# Patient Record
Sex: Female | Born: 1990 | Race: Black or African American | Hispanic: No | Marital: Single | Smoking: Never smoker
Health system: Southern US, Community
[De-identification: ages and names within clinical notes are randomized; demographics above are authoritative.]

## PROBLEM LIST (undated history)

## (undated) ENCOUNTER — Inpatient Hospital Stay (HOSPITAL_COMMUNITY): Payer: Self-pay

## (undated) DIAGNOSIS — O2342 Unspecified infection of urinary tract in pregnancy, second trimester: Secondary | ICD-10-CM

## (undated) DIAGNOSIS — A599 Trichomoniasis, unspecified: Secondary | ICD-10-CM

## (undated) DIAGNOSIS — F329 Major depressive disorder, single episode, unspecified: Secondary | ICD-10-CM

## (undated) DIAGNOSIS — A749 Chlamydial infection, unspecified: Secondary | ICD-10-CM

## (undated) DIAGNOSIS — B999 Unspecified infectious disease: Secondary | ICD-10-CM

## (undated) DIAGNOSIS — F32A Depression, unspecified: Secondary | ICD-10-CM

## (undated) DIAGNOSIS — N390 Urinary tract infection, site not specified: Secondary | ICD-10-CM

---
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
## 2012-04-24 ENCOUNTER — Encounter (HOSPITAL_COMMUNITY): Payer: Self-pay | Admitting: *Deleted

## 2012-04-24 ENCOUNTER — Inpatient Hospital Stay (HOSPITAL_COMMUNITY): Payer: Medicaid Other

## 2012-04-24 ENCOUNTER — Inpatient Hospital Stay (HOSPITAL_COMMUNITY)
Admission: AD | Admit: 2012-04-24 | Discharge: 2012-04-24 | Disposition: A | Discharge status: Home / Self Care | Payer: Medicaid Other | Source: Ambulatory Visit | Attending: Obstetrics & Gynecology | Admitting: Obstetrics & Gynecology

## 2012-04-24 DIAGNOSIS — R109 Unspecified abdominal pain: Secondary | ICD-10-CM | POA: Insufficient documentation

## 2012-04-24 DIAGNOSIS — B373 Candidiasis of vulva and vagina: Secondary | ICD-10-CM

## 2012-04-24 DIAGNOSIS — O2390 Unspecified genitourinary tract infection in pregnancy, unspecified trimester: Secondary | ICD-10-CM | POA: Insufficient documentation

## 2012-04-24 DIAGNOSIS — O99891 Other specified diseases and conditions complicating pregnancy: Secondary | ICD-10-CM | POA: Insufficient documentation

## 2012-04-24 DIAGNOSIS — B3731 Acute candidiasis of vulva and vagina: Secondary | ICD-10-CM | POA: Insufficient documentation

## 2012-04-24 DIAGNOSIS — J069 Acute upper respiratory infection, unspecified: Secondary | ICD-10-CM

## 2012-04-24 DIAGNOSIS — O3680X0 Pregnancy with inconclusive fetal viability, not applicable or unspecified: Secondary | ICD-10-CM

## 2012-04-24 DIAGNOSIS — O26899 Other specified pregnancy related conditions, unspecified trimester: Secondary | ICD-10-CM

## 2012-04-24 LAB — CBC WITH DIFFERENTIAL/PLATELET
Basophils Absolute: 0 10*3/uL (ref 0.0–0.1)
Eosinophils Absolute: 0.4 10*3/uL (ref 0.0–0.7)
Eosinophils Relative: 6 % — ABNORMAL HIGH (ref 0–5)
HCT: 37.5 % (ref 36.0–46.0)
Lymphocytes Relative: 39 % (ref 12–46)
MCH: 31.1 pg (ref 26.0–34.0)
MCHC: 34.1 g/dL (ref 30.0–36.0)
MCV: 91 fL (ref 78.0–100.0)
Monocytes Absolute: 0.8 10*3/uL (ref 0.1–1.0)
Platelets: 293 10*3/uL (ref 150–400)
RDW: 12.3 % (ref 11.5–15.5)

## 2012-04-24 LAB — POCT PREGNANCY, URINE: Preg Test, Ur: POSITIVE — AB

## 2012-04-24 LAB — URINALYSIS, ROUTINE W REFLEX MICROSCOPIC
Glucose, UA: NEGATIVE mg/dL
Hgb urine dipstick: NEGATIVE
Leukocytes, UA: NEGATIVE
pH: 6 (ref 5.0–8.0)

## 2012-04-24 LAB — WET PREP, GENITAL: Trich, Wet Prep: NONE SEEN

## 2012-04-24 LAB — HCG, QUANTITATIVE, PREGNANCY: hCG, Beta Chain, Quant, S: 11623 m[IU]/mL — ABNORMAL HIGH (ref <5)

## 2012-04-24 MED ORDER — FLUCONAZOLE 150 MG PO TABS
150.0000 mg | ORAL_TABLET | Freq: Once | ORAL | Status: AC
  Administered 2012-04-24: 150 mg via ORAL
  Filled 2012-04-24: qty 1

## 2012-04-24 NOTE — MAU Provider Note (Signed)
History     CSN: [id number]  Arrival date and time: 04/24/12 1947   First Provider Initiated Contact with Patient 04/24/12 2017      Chief Complaint  Patient presents with  . Abdominal Pain  . Back Pain   HPI Rose [name] is a 22 y.o. female who presents to MAU with abdominal pain. The pain started one week ago. She describes the pain as sharp that comes and goes. She rates the pains as 10/10. But hasn't taken anything for pain. The onset was gradual.  Unsure of LMP but positive homepregnancy test. Denies vaginal discharge or bleeding. Has had cough cold and congestion x one month. Hasn't taken any medication. Last pap smear 1 year ago and was normal. Hx of chlamydia and trichomonas. The history was provided by the patient.  OB History    Grav Para Term Preterm Abortions TAB SAB Ect Mult Living   3 2 2  0 0 0 0 0 0 2      Past Medical History  Diagnosis Date  . Urinary tract infection   . Trichomonosis   . Chlamydia     Past Surgical History  Procedure Date  . Cesarean section   . Cesarean section 02/02/2011    Procedure: CESAREAN SECTION;  Surgeon: Kathreen Cosier, MD;  Location: [facility];  Service: Gynecology;  Laterality: N/A;  Repeat    Family History  Problem Relation Age of Onset  . Diabetes Brother   . Diabetes Paternal Grandmother   . Cancer Paternal Grandfather     History  Substance Use Topics  . Smoking status: Never Smoker   . Smokeless tobacco: Not on file  . Alcohol Use: No    Allergies: No Known Allergies  Prescriptions prior to admission  Medication Sig Dispense Refill  . omeprazole (PRILOSEC) 20 MG capsule Take 1 capsule (20 mg total) by mouth daily.  30 capsule  0    Review of Systems  Constitutional: Negative for fever, chills and weight loss.  HENT: Positive for congestion and sore throat. Negative for ear pain, nosebleeds and neck pain.   Eyes: Negative for blurred vision, double vision, photophobia and pain.  Respiratory: Positive  for cough. Negative for shortness of breath and wheezing.   Cardiovascular: Negative for chest pain, palpitations and leg swelling.  Gastrointestinal: Positive for nausea, vomiting and abdominal pain. Negative for heartburn, diarrhea and constipation.  Genitourinary: Negative for dysuria, urgency and frequency.  Musculoskeletal: Positive for back pain. Negative for myalgias.  Skin: Negative for itching and rash.  Neurological: Positive for headaches. Negative for dizziness, sensory change, speech change, seizures and weakness.  Endo/Heme/Allergies: Does not bruise/bleed easily.  Psychiatric/Behavioral: Negative for depression and substance abuse. The patient is not nervous/anxious and does not have insomnia.    Blood pressure 123/56, pulse 95, temperature 98.6 F (37 C), temperature source Oral, resp. rate 16, height 4\' 11"  (1.499 m), weight 131 lb (59.421 kg), unknown if currently breastfeeding.  Physical Exam  Nursing note and vitals reviewed. Constitutional: She is oriented to person, place, and time. She appears well-developed and well-nourished. No distress.       Patient lying on exam table with her child on her abdomen, laughing and bouncing the child up and down. Appears very comfortable at this time.  HENT:  Head: Normocephalic and atraumatic.  Eyes: EOM are normal.  Neck: Neck supple.  Cardiovascular: Normal rate.   Respiratory: Effort normal.  GI: Soft. There is no tenderness.  Unable to reproduce the pain that the patient has had for the past week with deep palpation.  Genitourinary:       External genitalia without lesions. White discharge vaginal vault. Cervix long, closed, no CMT, no adnexal tenderness. Uterus slightly enlarged.   Musculoskeletal: Normal range of motion.  Neurological: She is alert and oriented to person, place, and time.  Skin: Skin is warm and dry.  Psychiatric: She has a normal mood and affect. Her behavior is normal. Judgment and thought content  normal.   Results for orders placed during the hospital encounter of 04/24/12 (from the past 24 hour(s))  URINALYSIS, ROUTINE W REFLEX MICROSCOPIC     Status: Abnormal   Collection Time   04/24/12  7:58 PM      Component Value Range   Color, Urine YELLOW  YELLOW   APPearance HAZY (*) CLEAR   Specific Gravity, Urine 1.025  1.005 - 1.030   pH 6.0  5.0 - 8.0   Glucose, UA NEGATIVE  NEGATIVE mg/dL   Hgb urine dipstick NEGATIVE  NEGATIVE   Bilirubin Urine NEGATIVE  NEGATIVE   Ketones, ur NEGATIVE  NEGATIVE mg/dL   Protein, ur NEGATIVE  NEGATIVE mg/dL   Urobilinogen, UA 0.2  0.0 - 1.0 mg/dL   Nitrite NEGATIVE  NEGATIVE   Leukocytes, UA NEGATIVE  NEGATIVE  POCT PREGNANCY, URINE     Status: Abnormal   Collection Time   04/24/12  8:03 PM      Component Value Range   Preg Test, Ur POSITIVE (*) NEGATIVE  CBC WITH DIFFERENTIAL     Status: Abnormal   Collection Time   04/24/12  8:30 PM      Component Value Range   WBC 6.3  4.0 - 10.5 K/uL   RBC 4.12  3.87 - 5.11 MIL/uL   Hemoglobin 12.8  12.0 - 15.0 g/dL   HCT 16.1  09.6 - 04.5 %   MCV 91.0  78.0 - 100.0 fL   MCH 31.1  26.0 - 34.0 pg   MCHC 34.1  30.0 - 36.0 g/dL   RDW 40.9  81.1 - 91.4 %   Platelets 293  150 - 400 K/uL   Neutrophils Relative 42 (*) 43 - 77 %   Neutro Abs 2.6  1.7 - 7.7 K/uL   Lymphocytes Relative 39  12 - 46 %   Lymphs Abs 2.4  0.7 - 4.0 K/uL   Monocytes Relative 13 (*) 3 - 12 %   Monocytes Absolute 0.8  0.1 - 1.0 K/uL   Eosinophils Relative 6 (*) 0 - 5 %   Eosinophils Absolute 0.4  0.0 - 0.7 K/uL   Basophils Relative 0  0 - 1 %   Basophils Absolute 0.0  0.0 - 0.1 K/uL  HCG, QUANTITATIVE, PREGNANCY     Status: Abnormal   Collection Time   04/24/12  8:30 PM      Component Value Range   hCG, Beta Chain, Quant, S [id number] (*) <5 mIU/mL  WET PREP, GENITAL     Status: Abnormal   Collection Time   04/24/12  8:35 PM      Component Value Range   Yeast Wet Prep HPF POC FEW (*) NONE SEEN   Trich, Wet Prep NONE SEEN  NONE  SEEN   Clue Cells Wet Prep HPF POC FEW (*) NONE SEEN   WBC, Wet Prep HPF POC FEW (*) NONE SEEN    US Ob Comp Less 14 Wks  04/24/2012  *RADIOLOGY REPORT*  Clinical Data: Pain.  History of previous C-sections.  Estimated gestational age by LMP is 10 weeks 3 days.  Quantitative beta HCG is 11,623.  OBSTETRIC <14 WK [place] AND TRANSVAGINAL OB US  Technique:  Both transabdominal and transvaginal ultrasound examinations were performed for complete evaluation of the gestation as well as the maternal uterus, adnexal regions, and pelvic cul-de-sac.  Transvaginal technique was performed to assess early pregnancy.  Comparison:  None.  Intrauterine gestational sac:  A circumscribed single intrauterine gestational sac is visualized with regular contours. Yolk sac: The yolk sac is not visualized. Embryo: The fetal pole is not visualized. Cardiac Activity: The fetal cardiac activity is not visualized.  MSD: 12.3 mm  6 w 0 d         [place] EDC: 12/18/2012  Maternal uterus/adnexae: No myometrial uterine mass lesions identified.  These ovaries appear symmetrical with normal follicular changes.  Right ovary measures 2.6 x 1.6 x 1.7 cm.  Left ovary measures 4.1 x 1.9 x 3 cm. No abnormal adnexal masses.  Small amount of free fluid in the pelvis.  IMPRESSION: Probable early intrauterine gestational sac, but no yolk sac, fetal pole, or cardiac activity yet visualized.  Recommend follow-up quantitative B-HCG levels and follow-up US in 14 days to confirm and assess viability. This recommendation follows SRU consensus guidelines: Diagnostic Criteria for Nonviable Pregnancy Early in the First Trimester.  Malva Limes Med 2013; 161:0960-45.   Original Report Authenticated By: Burman Nieves, M.D.    US Ob Transvaginal  04/24/2012  *RADIOLOGY REPORT*  Clinical Data: Pain.  History of previous C-sections.  Estimated gestational age by LMP is 10 weeks 3 days.  Quantitative beta HCG is 11,623.  OBSTETRIC <14 WK [place] AND TRANSVAGINAL OB US  Technique:   Both transabdominal and transvaginal ultrasound examinations were performed for complete evaluation of the gestation as well as the maternal uterus, adnexal regions, and pelvic cul-de-sac.  Transvaginal technique was performed to assess early pregnancy.  Comparison:  None.  Intrauterine gestational sac:  A circumscribed single intrauterine gestational sac is visualized with regular contours. Yolk sac: The yolk sac is not visualized. Embryo: The fetal pole is not visualized. Cardiac Activity: The fetal cardiac activity is not visualized.  MSD: 12.3 mm  6 w 0 d         [place] EDC: 12/18/2012  Maternal uterus/adnexae: No myometrial uterine mass lesions identified.  These ovaries appear symmetrical with normal follicular changes.  Right ovary measures 2.6 x 1.6 x 1.7 cm.  Left ovary measures 4.1 x 1.9 x 3 cm. No abnormal adnexal masses.  Small amount of free fluid in the pelvis.  IMPRESSION: Probable early intrauterine gestational sac, but no yolk sac, fetal pole, or cardiac activity yet visualized.  Recommend follow-up quantitative B-HCG levels and follow-up US in 14 days to confirm and assess viability. This recommendation follows SRU consensus guidelines: Diagnostic Criteria for Nonviable Pregnancy Early in the First Trimester.  Malva Limes Med 2013; 409:8119-14.   Original Report Authenticated By: Burman Nieves, M.D.     Assessment:  22 y.o. female with early IUGS   Monilia vaginosis   URI  Plan:  Diflucan  150 mg PO now   Follow up ultrasound in 2 weeks   Return for problems.   Robitussin for cough I have reviewed this patient's vital signs, nurses notes, appropriate labs and imaging. I have discussed findings with the patient and plan of care. Patient voices understanding.   Procedures  NEESE,HOPE, RN, FNP, BC  04/24/2012, 8:18 PM

## 2012-04-24 NOTE — MAU Note (Signed)
Pt unsure of LMP, + home UPT.  Having lower back and abd pain x 1 week.  Denies bleeding.

## 2012-04-25 LAB — GC/CHLAMYDIA PROBE AMP
CT Probe RNA: NEGATIVE
GC Probe RNA: NEGATIVE

## 2012-04-25 NOTE — MAU Provider Note (Signed)
Attestation of Attending Supervision of Advanced Practitioner (PA/CNM/NP): Evaluation and management procedures were performed by the Advanced Practitioner under my supervision and collaboration.  I have reviewed the Advanced Practitioner's note and chart, and I agree with the management and plan.  Isael Stille, MD, FACOG Attending Obstetrician & Gynecologist Faculty Practice, [facility]  

## 2012-05-08 ENCOUNTER — Ambulatory Visit (HOSPITAL_COMMUNITY)
Admission: RE | Admit: 2012-05-08 | Discharge: 2012-05-08 | Disposition: A | Discharge status: Home / Self Care | Payer: Medicaid Other | Source: Ambulatory Visit | Attending: Nurse Practitioner | Admitting: Nurse Practitioner

## 2012-05-08 ENCOUNTER — Inpatient Hospital Stay (HOSPITAL_COMMUNITY)
Admission: AD | Admit: 2012-05-08 | Discharge: 2012-05-08 | Disposition: A | Discharge status: Home / Self Care | Payer: Medicaid Other | Source: Ambulatory Visit | Attending: Obstetrics | Admitting: Obstetrics

## 2012-05-08 ENCOUNTER — Encounter (HOSPITAL_COMMUNITY): Payer: Self-pay

## 2012-05-08 DIAGNOSIS — O020 Blighted ovum and nonhydatidiform mole: Secondary | ICD-10-CM

## 2012-05-08 DIAGNOSIS — O0289 Other abnormal products of conception: Secondary | ICD-10-CM

## 2012-05-08 DIAGNOSIS — O3680X0 Pregnancy with inconclusive fetal viability, not applicable or unspecified: Secondary | ICD-10-CM | POA: Insufficient documentation

## 2012-05-08 DIAGNOSIS — Z3689 Encounter for other specified antenatal screening: Secondary | ICD-10-CM | POA: Insufficient documentation

## 2012-05-08 DIAGNOSIS — R109 Unspecified abdominal pain: Secondary | ICD-10-CM | POA: Insufficient documentation

## 2012-05-08 MED ORDER — IBUPROFEN 600 MG PO TABS: 600.0000 mg | ORAL_TABLET | Freq: Four times a day (QID) | ORAL | Status: DC | PRN

## 2012-05-08 NOTE — MAU Provider Note (Signed)
Chief Complaint: Follow-up    SUBJECTIVE HPI: Rose [name] is a 22 y.o. [id number] who presented 2 wks ago with cramping abdominal pain and had US showing empty IUGS c/w [redacted]w[redacted]d and quant >11,000.  Denies abdominal pain, cramping or vaginal bleeding. No subjective pregnancy sx. Pregnancy is desired.  Past Medical History  Diagnosis Date  . Urinary tract infection   . Trichomonosis   . Chlamydia    OB History    Grav Para Term Preterm Abortions TAB SAB Ect Mult Living   4 2 2  0 0 0 0 0 0 2     # Outc Date GA Lbr Len/2nd Wgt Sex Del Anes PTL Lv   1 TRM 10/12 [redacted]w[redacted]d 00:00 7lb3.3oz(3.27kg) F CS-Vac Spinal     2 CUR            3 TRM            4 GRA              Past Surgical History  Procedure Date  . Cesarean section   . Cesarean section 02/02/2011    Procedure: CESAREAN SECTION;  Surgeon: Kathreen Cosier, MD;  Location: [facility];  Service: Gynecology;  Laterality: N/A;  Repeat   History   Social History  . Marital Status: Single    Spouse Name: N/A    Number of Children: N/A  . Years of Education: N/A   Occupational History  . Not on file.   Social History Main Topics  . Smoking status: Never Smoker   . Smokeless tobacco: Not on file  . Alcohol Use: No  . Drug Use: No  . Sexually Active: Yes    Birth Control/ Protection: None   Other Topics Concern  . Not on file   Social History Narrative  . No narrative on file   No current facility-administered medications on file prior to encounter.   No current outpatient prescriptions on file prior to encounter.   No Known Allergies  ROS: Pertinent items in HPI  OBJECTIVE Blood pressure 118/66, temperature 99 F (37.2 C), temperature source Oral, resp. rate 16, SpO2 100.00%. GENERAL: Well-developed, well-nourished female in no acute distress.  HEENT: Normocephalic HEART: normal rate RESP: normal effort ABDOMEN: Soft, non-tender EXTREMITIES: Nontender, no edema NEURO: Alert and oriented LAB RESULTS No results  found for this or any previous visit (from the past 24 hour(s)).  IMAGING US Ob Comp Less 14 Wks  04/24/2012  *RADIOLOGY REPORT*  Clinical Data: Pain.  History of previous C-sections.  Estimated gestational age by LMP is 10 weeks 3 days.  Quantitative beta HCG is 11,623.  OBSTETRIC <14 WK [place] AND TRANSVAGINAL OB US  Technique:  Both transabdominal and transvaginal ultrasound examinations were performed for complete evaluation of the gestation as well as the maternal uterus, adnexal regions, and pelvic cul-de-sac.  Transvaginal technique was performed to assess early pregnancy.  Comparison:  None.  Intrauterine gestational sac:  A circumscribed single intrauterine gestational sac is visualized with regular contours. Yolk sac: The yolk sac is not visualized. Embryo: The fetal pole is not visualized. Cardiac Activity: The fetal cardiac activity is not visualized.  MSD: 12.3 mm  6 w 0 d         [place] EDC: 12/18/2012  Maternal uterus/adnexae: No myometrial uterine mass lesions identified.  These ovaries appear symmetrical with normal follicular changes.  Right ovary measures 2.6 x 1.6 x 1.7 cm.  Left ovary measures 4.1 x 1.9 x 3 cm. No  abnormal adnexal masses.  Small amount of free fluid in the pelvis.  IMPRESSION: Probable early intrauterine gestational sac, but no yolk sac, fetal pole, or cardiac activity yet visualized.  Recommend follow-up quantitative B-HCG levels and follow-up US in 14 days to confirm and assess viability. This recommendation follows SRU consensus guidelines: Diagnostic Criteria for Nonviable Pregnancy Early in the First Trimester.  Malva Limes Med 2013; 161:0960-45.   Original Report Authenticated By: Burman Nieves, M.D.    !/27/2013: IUGS measures 16.8 mm, no YS, no FP; adnexae normal  ASSESSMENT 1. Blighted ovum     PLAN Discussed options of cytotec, D&E, expectant management. Couple elect expectant management at this time.  Discharge home with AVS blighted ovum Follow-up Information      Follow up with [facility]. In 2 weeks. (Someone from the Clinic will call you with an appointment)           Medication List     As of 05/08/2012  6:14 PM    TAKE these medications         ibuprofen 600 MG tablet   Commonly known as: ADVIL,MOTRIN   Take 1 tablet (600 mg total) by mouth every 6 (six) hours as needed for pain.      prenatal multivitamin Tabs   Take 1 tablet by mouth daily.          Danae Orleans, CNM 05/08/2012  6:10 PM

## 2012-05-08 NOTE — MAU Note (Signed)
Patient to MAU after ultrasound. Patient states she does have a little cramping but no bleeding.

## 2012-05-08 NOTE — MAU Provider Note (Signed)
Attestation of Attending Supervision of Advanced Practitioner (CNM/NP): Evaluation and management procedures were performed by the Advanced Practitioner under my supervision and collaboration. I have reviewed the Advanced Practitioner's note and chart, and I agree with the management and plan.  The [place] today shows blighted ovum  Rose [name],Rose H. 10:51 PM

## 2012-05-25 ENCOUNTER — Ambulatory Visit: Payer: Medicaid Other | Admitting: Medical

## 2012-05-25 ENCOUNTER — Encounter: Payer: Medicaid Other | Admitting: Medical

## 2012-06-07 ENCOUNTER — Encounter: Payer: Self-pay | Admitting: Medical

## 2012-06-19 ENCOUNTER — Ambulatory Visit: Payer: Medicaid Other | Admitting: Medical

## 2012-11-14 ENCOUNTER — Inpatient Hospital Stay (HOSPITAL_COMMUNITY)
Admission: AD | Admit: 2012-11-14 | Discharge: 2012-11-15 | Disposition: A | Discharge status: Home / Self Care | Payer: Medicaid Other | Source: Ambulatory Visit | Attending: Emergency Medicine | Admitting: Emergency Medicine

## 2012-11-14 ENCOUNTER — Inpatient Hospital Stay (HOSPITAL_COMMUNITY): Payer: Medicaid Other

## 2012-11-14 ENCOUNTER — Encounter (HOSPITAL_COMMUNITY): Payer: Self-pay | Admitting: *Deleted

## 2012-11-14 DIAGNOSIS — O99891 Other specified diseases and conditions complicating pregnancy: Secondary | ICD-10-CM | POA: Insufficient documentation

## 2012-11-14 DIAGNOSIS — R1013 Epigastric pain: Secondary | ICD-10-CM

## 2012-11-14 DIAGNOSIS — R079 Chest pain, unspecified: Secondary | ICD-10-CM | POA: Insufficient documentation

## 2012-11-14 LAB — COMPREHENSIVE METABOLIC PANEL
Alkaline Phosphatase: 53 U/L (ref 39–117)
BUN: 9 mg/dL (ref 6–23)
Creatinine, Ser: 0.71 mg/dL (ref 0.50–1.10)
GFR calc Af Amer: >90 mL/min (ref >90)
Glucose, Bld: 85 mg/dL (ref 70–99)
Potassium: 3.9 mEq/L (ref 3.5–5.1)
Total Bilirubin: 0.2 mg/dL — ABNORMAL LOW (ref 0.3–1.2)
Total Protein: 6.2 g/dL (ref 6.0–8.3)

## 2012-11-14 LAB — RAPID URINE DRUG SCREEN, HOSP PERFORMED
Amphetamines: NOT DETECTED
Cocaine: NOT DETECTED
Opiates: NOT DETECTED

## 2012-11-14 LAB — URINALYSIS, ROUTINE W REFLEX MICROSCOPIC
Bilirubin Urine: NEGATIVE
Glucose, UA: NEGATIVE mg/dL
Hgb urine dipstick: NEGATIVE
Specific Gravity, Urine: 1.025 (ref 1.005–1.030)

## 2012-11-14 NOTE — ED Provider Notes (Signed)
CSN: [id number]     Arrival date & time 11/14/12  1410 History     First MD Initiated Contact with Patient 11/14/12 2112     Chief Complaint  Patient presents with  . Chest Pain  . Abdominal Pain   (Consider location/radiation/quality/duration/timing/severity/associated sxs/prior Treatment) HPI  Rose [name] is a 22 y.o. female [redacted] weeks pregnant, sent from [facility] hospital for evaluation of chest pain x 1 week. Patient is complaining of sharp chest pain originating in the epigastrium radiating to the left side of the chest intermittently, the episodes last for several seconds. They come approximately 3 times per day. They're not associated with diaphoresis, nausea, vomiting, shortness of breath. Patient denies history of DVT or PE, recent trips, immobilizations, calf pain or swelling, abnormal vaginal discharge, change in bowel or bladder habits, fever. Patient does endorse a dry cough and rhinorrhea which she attributes to her seasonal allergies. Father died of MI at age 58.   Past Medical History  Diagnosis Date  . Urinary tract infection   . Trichomonosis   . Chlamydia    Past Surgical History  Procedure Laterality Date  . Cesarean section    . Cesarean section  02/02/2011    Procedure: CESAREAN SECTION;  Surgeon: Kathreen Cosier, MD;  Location: [facility];  Service: Gynecology;  Laterality: N/A;  Repeat   Family History  Problem Relation Age of Onset  . Diabetes Brother   . Diabetes Paternal Grandmother   . Cancer Paternal Grandfather    History  Substance Use Topics  . Smoking status: Never Smoker   . Smokeless tobacco: Not on file  . Alcohol Use: No   OB History   Grav Para Term Preterm Abortions TAB SAB Ect Mult Living   4 2 2  0 1 0 1 0 0 2     Review of Systems 10 systems reviewed and found to be negative, except as noted in the HPI   Allergies  Review of patient's allergies indicates no known allergies.  Home Medications   Current Outpatient Rx  Name   Route  Sig  Dispense  Refill  . Prenatal Vit-Fe Fumarate-FA (PRENATAL MULTIVITAMIN) TABS   Oral   Take 1 tablet by mouth daily.          BP 105/62  Pulse 73  Temp(Src) 99.4 F (37.4 C) (Oral)  Resp 18  Ht 4\' 11"  (1.499 m)  Wt 114 lb (51.71 kg)  BMI 23.01 kg/m2  SpO2 100%  LMP 06/29/2012  Breastfeeding? Unknown Physical Exam  Nursing note and vitals reviewed. Constitutional: She is oriented to person, place, and time. She appears well-developed and well-nourished. No distress.  HENT:  Head: Normocephalic and atraumatic.  Mouth/Throat: Oropharynx is clear and moist.  Eyes: Conjunctivae and EOM are normal. Pupils are equal, round, and reactive to light.  Neck: Normal range of motion.  Cardiovascular: Normal rate, regular rhythm and intact distal pulses.   Pulmonary/Chest: Effort normal and breath sounds normal. No stridor. No respiratory distress. She has no wheezes. She has no rales. She exhibits no tenderness.  Abdominal: Soft. Bowel sounds are normal. She exhibits no distension and no mass. There is tenderness. There is no rebound and no guarding.  Gravid, fundal height at the umbilicus  Minimal diffuse tenderness to palpation with no guarding or rebound  Musculoskeletal: Normal range of motion.  Neurological: She is alert and oriented to person, place, and time.  Psychiatric: She has a normal mood and affect.    ED  Course   Procedures (including critical care time)  Labs Reviewed  COMPREHENSIVE METABOLIC PANEL - Abnormal; Notable for the following:    Albumin 2.9 (*)    Total Bilirubin 0.2 (*)    All other components within normal limits  TROPONIN I  URINE RAPID DRUG SCREEN (HOSP PERFORMED)  URINALYSIS, ROUTINE W REFLEX MICROSCOPIC   Dg Chest 2 View  11/14/2012   *RADIOLOGY REPORT*  Clinical Data: Chest pain, cough  CHEST - 2 VIEW  Comparison: None.  Findings: Normal heart size and vascularity.  Lungs clear.  No effusion or pneumothorax.  No collapse or  consolidation.  Slight upper thoracic scoliosis.  IMPRESSION: No acute chest process   Original Report Authenticated By: Judie Petit. Miles Costain, M.D.    Date: 11/14/2012 23:56  Rate: 80  Rhythm: normal sinus rhythm  QRS Axis: normal  Intervals: normal  ST/T Wave abnormalities: normal  Conduction Disutrbances:none  Narrative Interpretation:   Old EKG Reviewed: unchanged    1. Epigastric abdominal pain   2. Chest pain     MDM   Filed Vitals:   11/15/12 0000 11/15/12 0015 11/15/12 0030 11/15/12 0045  BP: 110/60 116/65 105/67 122/65  Pulse: 83 81 78 79  Temp:      TempSrc:      Resp: 18 21 19 19   Height:      Weight:      SpO2: 99% 100% 100% 100%     Rose [name] is a 22 y.o. female [redacted] weeks pregnant presenting with very atypical chest pain. EKGs performed at [facility] hospital show's Brugada-like changes in V1 with coved downward sloping ST segments. There's also a right-sided EKG performed at [facility] hospital with no acute ischemic changes. EKG performed in the emergency room: Does not show any acute abnormalities there may be a incomplete right bundle branch block with the other lites changes are are absent. I discussed this with attending and also consulted cardiology Dr. Alvester Morin for who has looked at all 3 EKGs and feels reassured that there are no consistent changes that require intervention at this time, he doubts Brugada syndrome.  Discussed case with attending who agrees with plan and stability to d/c to home.    Pt is hemodynamically stable, appropriate for, and amenable to discharge at this time. Pt verbalized understanding and agrees with care plan. All questions answered. Outpatient follow-up and specific return precautions discussed.    Note: Portions of this report may have been transcribed using voice recognition software. Every effort was made to ensure accuracy; however, inadvertent computerized transcription errors may be present    Wynetta Emery, PA-C 11/16/12 [phone]

## 2012-11-14 NOTE — MAU Note (Signed)
Patient Rose [name] presents today with c/o sharp lower abdominal and mid-chest pain x 2 weeks. Denies pain radiating to neck or shoulder, denies shob or diaphoresis. Has not tried anything at home for the pain. Has just moved back to town has appointment on Monday with Dr. Elsie Stain office for prenatal care. Denies contractions, LOF, nor bleeding.

## 2012-11-15 NOTE — ED Provider Notes (Addendum)
Medical screening examination/treatment/procedure(s) were conducted as a shared visit with non-physician practitioner(s) and myself.  I personally evaluated the patient during the encounter  Patient seen by me. [facility] no shortness of breath no hypoxia no significant tachycardia. Clinically patient has no leg swelling denies shortness of breath unlikely to be pulmonary embolism. Patient is [redacted] weeks pregnant. No leg swelling.   Also have reviewed the EKGs. I think we have a good explanation for the changes were seen in the lateral leads. The initial EKG was done [facility] then removed all the stickers and put them under the right breast to do a right sided EKG. Then the stickers were removed and placed back under the left side. A right-sided EKG knowing this is consistent with a right-sided EKG. Comparison of the initial EKG that was left-sided with the EKG we did this evening lateral leads are the same with the exception of the 2 no other significant changes. We discussed this with cardiology they concur that there be a significant cardiac event that there would be marked changes. In addition there is some family history of sudden death but that was in her father at age 72 he had uncontrolled diabetes hypertension and cholesterol. No strong family history of unexplained sudden death.  Patient clinically is stable to be discharged home we have not completely ruled out pulmonary embolism patient understands this and has per cautions. Clinically do not feel that we need to proceed with CT angios.     Shelda Jakes, MD 11/15/12 Moses Manners  Shelda Jakes, MD 11/18/12 1054

## 2012-11-15 NOTE — MAU Provider Note (Signed)
Chief Complaint: Chest Pain and Abdominal Pain  @MAUPATCONTACT @ SUBJECTIVE HPI: Rose [name] is a 22 y.o. [id number] at [redacted]w[redacted]d by LMP who presents to maternity admissions reporting left sided chest pain radiating into her left breast which is intermittent, occuring 2-3 times/day.  She denies feeling the chest pain currently.  She also reports bilateral inguinal pain when changing positions.  She denies SOB, chest pressure, h/a, dizziness, n/v, or fever chills.  She is feeling fetal movement and denies LOF, vaginal bleeding, vaginal itching/burning, or urinary symptoms.     Past Medical History  Diagnosis Date  . Urinary tract infection   . Trichomonosis   . Chlamydia    Past Surgical History  Procedure Laterality Date  . Cesarean section    . Cesarean section  02/02/2011    Procedure: CESAREAN SECTION;  Surgeon: Kathreen Cosier, MD;  Location: [facility];  Service: Gynecology;  Laterality: N/A;  Repeat   History   Social History  . Marital Status: Single    Spouse Name: N/A    Number of Children: N/A  . Years of Education: N/A   Occupational History  . Not on file.   Social History Main Topics  . Smoking status: Never Smoker   . Smokeless tobacco: Not on file  . Alcohol Use: No  . Drug Use: No  . Sexually Active: Yes    Birth Control/ Protection: None   Other Topics Concern  . Not on file   Social History Narrative  . No narrative on file   No current facility-administered medications on file prior to encounter.   Current Outpatient Prescriptions on File Prior to Encounter  Medication Sig Dispense Refill  . Prenatal Vit-Fe Fumarate-FA (PRENATAL MULTIVITAMIN) TABS Take 1 tablet by mouth daily.       No Known Allergies  ROS: Pertinent items in HPI  OBJECTIVE Blood pressure 122/65, pulse 79, temperature 99.4 F (37.4 C), temperature source Oral, resp. rate 19, height 4\' 11"  (1.499 m), weight 114 lb (51.71 kg), last menstrual period 06/29/2012, SpO2 100.00%, unknown  if currently breastfeeding. GENERAL: Well-developed, well-nourished female in no acute distress.  HEENT: Normocephalic HEART: normal rate, rhythm, heart sounds RESP: normal effort, lung sounds clear bilaterally in all lobes CHEST: nontender, no palpable musculoskeletal pain ABDOMEN: Soft, non-tender EXTREMITIES: Nontender, no edema NEURO: Alert and oriented  FHTs present and obtained by RN  LAB RESULTS Results for orders placed during the hospital encounter of 11/14/12 (from the past 24 hour(s))  URINE RAPID DRUG SCREEN (HOSP PERFORMED)     Status: None   Collection Time    11/14/12  3:16 PM      Result Value Range   Opiates NONE DETECTED  NONE DETECTED   Cocaine NONE DETECTED  NONE DETECTED   Benzodiazepines NONE DETECTED  NONE DETECTED   Amphetamines NONE DETECTED  NONE DETECTED   Tetrahydrocannabinol NONE DETECTED  NONE DETECTED   Barbiturates NONE DETECTED  NONE DETECTED  URINALYSIS, ROUTINE W REFLEX MICROSCOPIC     Status: None   Collection Time    11/14/12  3:16 PM      Result Value Range   Color, Urine YELLOW  YELLOW   APPearance CLEAR  CLEAR   Specific Gravity, Urine 1.025  1.005 - 1.030   pH 6.5  5.0 - 8.0   Glucose, UA NEGATIVE  NEGATIVE mg/dL   Hgb urine dipstick NEGATIVE  NEGATIVE   Bilirubin Urine NEGATIVE  NEGATIVE   Ketones, ur NEGATIVE  NEGATIVE mg/dL  Protein, ur NEGATIVE  NEGATIVE mg/dL   Urobilinogen, UA 0.2  0.0 - 1.0 mg/dL   Nitrite NEGATIVE  NEGATIVE   Leukocytes, UA NEGATIVE  NEGATIVE  COMPREHENSIVE METABOLIC PANEL     Status: Abnormal   Collection Time    11/14/12  5:28 PM      Result Value Range   Sodium 135  135 - 145 mEq/L   Potassium 3.9  3.5 - 5.1 mEq/L   Chloride 103  96 - 112 mEq/L   CO2 23  19 - 32 mEq/L   Glucose, Bld 85  70 - 99 mg/dL   BUN 9  6 - 23 mg/dL   Creatinine, Ser 1.61  0.50 - 1.10 mg/dL   Calcium 9.5  8.4 - 09.6 mg/dL   Total Protein 6.2  6.0 - 8.3 g/dL   Albumin 2.9 (*) 3.5 - 5.2 g/dL   AST 11  0 - 37 U/L   ALT 5  0  - 35 U/L   Alkaline Phosphatase 53  39 - 117 U/L   Total Bilirubin 0.2 (*) 0.3 - 1.2 mg/dL   GFR calc non Af Amer >90  >90 mL/min   GFR calc Af Amer >90  >90 mL/min  TROPONIN I     Status: None   Collection Time    11/14/12  5:29 PM      Result Value Range   Troponin I <0.30  <0.30 ng/mL   2 EKGs done at [facility]:  First, traditional 12 lead showed T wave abnormalities--read by Dr Tyson Alias at [facility] Per Dr Tyson Alias, a right sided EKG was ordered, also showing abnormalities consistent with possible infarct  ASSESSMENT Chest pain Round ligament pain  PLAN Per Dr Tyson Alias, pt transferred via [facility] to [facility] for further cardiac evaluation Pt to keep initial prenatal appointment scheduled with Dr Gaynell Face Return to MAU as needed    Medication List    ASK your doctor about these medications       prenatal multivitamin Tabs tablet  Take 1 tablet by mouth daily.           Follow-up Information   Follow up with Kathreen Cosier, MD.   Contact information:   [address] [place] [address] [phone]       Follow up with [facility] EMERGENCY DEPARTMENT. (If symptoms worsen)    Contact information:   [address] [id number] [place] [address] [phone]      Sharen Counter Certified Nurse-Midwife 11/15/2012  9:16 AM

## 2012-11-21 NOTE — MAU Provider Note (Signed)
Attestation of Attending Supervision of Advanced Practitioner (CNM/NP): Evaluation and management procedures were performed by the Advanced Practitioner under my supervision and collaboration. I have reviewed the Advanced Practitioner's note and chart, and I agree with the management and plan.  Aitanna Haubner H. 3:34 PM

## 2012-11-28 LAB — OB RESULTS CONSOLE HEPATITIS B SURFACE ANTIGEN: Hepatitis B Surface Ag: NEGATIVE

## 2012-11-28 LAB — OB RESULTS CONSOLE RPR: RPR: NONREACTIVE

## 2013-03-05 ENCOUNTER — Other Ambulatory Visit: Payer: Self-pay | Admitting: Obstetrics

## 2013-03-05 LAB — OB RESULTS CONSOLE GC/CHLAMYDIA: Chlamydia: NEGATIVE

## 2013-03-30 NOTE — H&P (Signed)
NAMEMARENDA, ACCARDI NO.:  [id number]  MEDICAL RECORD NO.:  [id number]  LOCATION:                                 FACILITY:  PHYSICIAN:  Kathreen Cosier, M.D.DATE OF BIRTH:  [birth year]  DATE OF ADMISSION:  04/03/2013 DATE OF DISCHARGE:                             HISTORY & PHYSICAL   HISTORY OF PRESENT ILLNESS:  The patient is a 22 year old, gravida 4, para 2-0-0-2 who had 2 previous C-sections.  Her due date is April 10, 2013, and she is scheduled for repeat C-section.  Pregnancy has been uncomplicated.  She had a positive GBS.  Otherwise, no problems.  PAST MEDICAL HISTORY:  Negative.  PAST SURGICAL HISTORY:  Two C-sections.  SOCIAL HISTORY:  Negative.  SYSTEM REVIEW:  Negative.  PHYSICAL EXAMINATION:  GENERAL:  Well-developed female in no distress. HEENT:  Negative. LUNGS:  Clear to P and A. HEART:  Regular rhythm.  No murmurs, no gallops. BREASTS:  Engorged. ABDOMEN:  Term.  Cervix long and closed. EXTREMITIES:  Negative.          ______________________________ Kathreen Cosier, M.D.     BAM/MEDQ  D:  03/28/2013  T:  03/29/2013  Job:  [id number]

## 2013-04-02 ENCOUNTER — Encounter (HOSPITAL_COMMUNITY): Payer: Self-pay

## 2013-04-02 ENCOUNTER — Inpatient Hospital Stay (HOSPITAL_COMMUNITY)
Admission: AD | Admit: 2013-04-02 | Discharge: 2013-04-02 | Disposition: A | Discharge status: Home / Self Care | Payer: Medicaid Other | Source: Ambulatory Visit | Attending: Obstetrics | Admitting: Obstetrics

## 2013-04-02 ENCOUNTER — Encounter (HOSPITAL_COMMUNITY)
Admission: RE | Admit: 2013-04-02 | Discharge: 2013-04-02 | Disposition: A | Discharge status: Home / Self Care | Payer: Medicaid Other | Source: Ambulatory Visit | Attending: Obstetrics | Admitting: Obstetrics

## 2013-04-02 ENCOUNTER — Encounter (HOSPITAL_COMMUNITY): Payer: Self-pay | Admitting: *Deleted

## 2013-04-02 DIAGNOSIS — O368190 Decreased fetal movements, unspecified trimester, not applicable or unspecified: Secondary | ICD-10-CM | POA: Insufficient documentation

## 2013-04-02 DIAGNOSIS — Z3689 Encounter for other specified antenatal screening: Secondary | ICD-10-CM

## 2013-04-02 DIAGNOSIS — Z36 Encounter for antenatal screening of mother: Secondary | ICD-10-CM

## 2013-04-02 LAB — CBC
HCT: 30.9 % — ABNORMAL LOW (ref 36.0–46.0)
Platelets: 233 10*3/uL (ref 150–400)
RDW: 13.8 % (ref 11.5–15.5)
WBC: 5.5 10*3/uL (ref 4.0–10.5)

## 2013-04-02 MED ORDER — CEFAZOLIN SODIUM-DEXTROSE 2-3 GM-% IV SOLR: 2.0000 g | Freq: Once | INTRAVENOUS | Status: DC

## 2013-04-02 NOTE — MAU Note (Signed)
Patient states she has not felt as much fetal movement today as usual. Denies bleeding, pain or leaking. Planned repeat cesarean section for 12-23.

## 2013-04-02 NOTE — MAU Provider Note (Signed)
History     CSN: [id number]  Arrival date and time: 04/02/13 1017   First Provider Initiated Contact with Patient 04/02/13 1108      Chief Complaint  Patient presents with  . Decreased Fetal Movement   HPI Rose [name] is a 22 y.o. [id number] at [id number] who presents to MAU today with complaint of decreased fetal movement since the morning. The patient states that since arrival in MAU she has had good fetal movement. She denies contractions, vaginal bleeding, discharge or LOF. She is scheduled for a c-section tomorrow morning.   OB History   Grav Para Term Preterm Abortions TAB SAB Ect Mult Living   4 2 2  0 1 0 1 0 0 2      Past Medical History  Diagnosis Date  . Urinary tract infection   . Trichomonosis     hx  . Chlamydia     Past Surgical History  Procedure Laterality Date  . Cesarean section  2010  . Cesarean section  02/02/2011    Procedure: CESAREAN SECTION;  Surgeon: Kathreen Cosier, MD;  Location: [facility];  Service: Gynecology;  Laterality: N/A;  Repeat    Family History  Problem Relation Age of Onset  . Diabetes Paternal Grandmother   . Hypertension Paternal Grandmother   . Cancer Paternal Grandfather     brain  . Hypertension Mother   . Diabetes Mother   . Hypertension Father   . Diabetes Father   . Hearing loss Neg Hx     History  Substance Use Topics  . Smoking status: Never Smoker   . Smokeless tobacco: Never Used  . Alcohol Use: No    Allergies: No Known Allergies  Prescriptions prior to admission  Medication Sig Dispense Refill  . Prenatal Vit-Fe Fumarate-FA (PRENATAL MULTIVITAMIN) TABS Take 1 tablet by mouth daily.        Review of Systems  Gastrointestinal: Negative for abdominal pain.  Genitourinary:       Neg - vaginal bleeding, discharge, LOF   Physical Exam   Blood pressure 126/76, pulse 87, temperature 98.7 F (37.1 C), temperature source Oral, last menstrual period 06/29/2012.  Physical Exam  Constitutional: She  is oriented to person, place, and time. She appears well-developed and well-nourished. No distress.  HENT:  Head: Normocephalic and atraumatic.  Cardiovascular: Normal rate.   Respiratory: Effort normal.  GI: Soft. She exhibits no distension and no mass. There is no tenderness. There is no rebound and no guarding.  Neurological: She is alert and oriented to person, place, and time.  Skin: Skin is warm and dry. No erythema.  Psychiatric: She has a normal mood and affect.   Results for orders placed during the hospital encounter of 04/02/13 (from the past 24 hour(s))  CBC     Status: Abnormal   Collection Time    04/02/13  9:50 AM      Result Value Range   WBC 5.5  4.0 - 10.5 K/uL   RBC 3.75 (*) 3.87 - 5.11 MIL/uL   Hemoglobin 9.8 (*) 12.0 - 15.0 g/dL   HCT 84.6 (*) 96.2 - 95.2 %   MCV 82.4  78.0 - 100.0 fL   MCH 26.1  26.0 - 34.0 pg   MCHC 31.7  30.0 - 36.0 g/dL   RDW 84.1  32.4 - 40.1 %   Platelets 233  150 - 400 K/uL  TYPE AND SCREEN     Status: None   Collection Time  04/02/13  9:50 AM      Result Value Range   ABO/RH(D) B POS     Antibody Screen NEG     Sample Expiration 04/05/2013     Fetal Monitoring: Baseline: 140 bpm, moderate variability, + accelerations, no decelerations Contractions: irregular, mild palpable MAU Course  Procedures None  MDM Reactive NST. Patient re-assured.   Assessment and Plan  A: Reactive NST  P: Discharge home  Patient is re-assured of fetal well-being Kick counts discussed Labor precautions discussed Patient advised to keep appointment as scheduled for c-section tomorrow Patient my return to MAU as needed or if her condition should change or worsen  Freddi Starr, PA-C  04/02/2013, 11:08 AM

## 2013-04-02 NOTE — Patient Instructions (Addendum)
   Your procedure is scheduled on: Tuesday, Dec 23  Enter through the [employer] of [facility] at: 6 AM Pick up the phone at the desk and dial [phone] and inform us of your arrival.  Please call this number if you have any problems the morning of surgery: [phone]  Remember: Do not eat or drink after midnight: Monday Take these medicines the morning of surgery with a SIP OF WATER:  None  Do not wear jewelry, make-up, or FINGER nail polish No metal in your hair or on your body. Do not wear lotions, powders, perfumes. You may wear deodorant.  Please use your CHG wash as directed prior to surgery.  Do not shave anywhere for at least 12 hours prior to first CHG shower.  Do not bring valuables to the hospital. Contacts, dentures or bridgework may not be worn into surgery.  Leave suitcase in the car. After Surgery it may be brought to your room. For patients being admitted to the hospital, checkout time is 11:00am the day of discharge.  Home with grandmother Rose [name].

## 2013-04-02 NOTE — MAU Note (Signed)
Reports + fm now.  Scheduled for c/s tomorrow.

## 2013-04-03 ENCOUNTER — Encounter (HOSPITAL_COMMUNITY): Payer: Medicaid Other | Admitting: Anesthesiology

## 2013-04-03 ENCOUNTER — Inpatient Hospital Stay (HOSPITAL_COMMUNITY)
Admission: RE | Admit: 2013-04-03 | Discharge: 2013-04-05 | DRG: 766 | Disposition: A | Discharge status: Home / Self Care | Payer: Medicaid Other | Source: Ambulatory Visit | Attending: Obstetrics | Admitting: Obstetrics

## 2013-04-03 ENCOUNTER — Encounter (HOSPITAL_COMMUNITY)
Admission: RE | Disposition: A | Discharge status: Home / Self Care | Payer: Self-pay | Source: Ambulatory Visit | Attending: Obstetrics

## 2013-04-03 ENCOUNTER — Encounter (HOSPITAL_COMMUNITY): Payer: Self-pay | Admitting: Anesthesiology

## 2013-04-03 ENCOUNTER — Inpatient Hospital Stay (HOSPITAL_COMMUNITY): Payer: Medicaid Other | Admitting: Anesthesiology

## 2013-04-03 DIAGNOSIS — O9279 Other disorders of lactation: Secondary | ICD-10-CM | POA: Diagnosis present

## 2013-04-03 DIAGNOSIS — Z22330 Carrier of Group B streptococcus: Secondary | ICD-10-CM

## 2013-04-03 DIAGNOSIS — Z98891 History of uterine scar from previous surgery: Secondary | ICD-10-CM

## 2013-04-03 DIAGNOSIS — O99892 Other specified diseases and conditions complicating childbirth: Secondary | ICD-10-CM | POA: Diagnosis present

## 2013-04-03 DIAGNOSIS — O34219 Maternal care for unspecified type scar from previous cesarean delivery: Principal | ICD-10-CM | POA: Diagnosis present

## 2013-04-03 LAB — PREPARE RBC (CROSSMATCH)

## 2013-04-03 SURGERY — Surgical Case
Anesthesia: Spinal | Site: Abdomen

## 2013-04-03 MED ORDER — PHENYLEPHRINE 8 MG IN D5W 100 ML (0.08MG/ML) PREMIX OPTIME
INJECTION | INTRAVENOUS | Status: DC | PRN
  Administered 2013-04-03: 60 ug/min via INTRAVENOUS

## 2013-04-03 MED ORDER — INFLUENZA VAC SPLIT QUAD 0.5 ML IM SUSP
0.5000 mL | INTRAMUSCULAR | Status: AC
  Administered 2013-04-04: 0.5 mL via INTRAMUSCULAR
  Filled 2013-04-03: qty 0.5

## 2013-04-03 MED ORDER — ONDANSETRON HCL 4 MG PO TABS: 4.0000 mg | ORAL_TABLET | ORAL | Status: DC | PRN

## 2013-04-03 MED ORDER — SODIUM CHLORIDE 0.9 % IJ SOLN: 3.0000 mL | INTRAMUSCULAR | Status: DC | PRN

## 2013-04-03 MED ORDER — NALOXONE HCL 0.4 MG/ML IJ SOLN: 0.4000 mg | INTRAMUSCULAR | Status: DC | PRN

## 2013-04-03 MED ORDER — MORPHINE SULFATE (PF) 0.5 MG/ML IJ SOLN
INTRAMUSCULAR | Status: DC | PRN
  Administered 2013-04-03: .2 mg via INTRATHECAL

## 2013-04-03 MED ORDER — DIPHENHYDRAMINE HCL 25 MG PO CAPS
25.0000 mg | ORAL_CAPSULE | Freq: Four times a day (QID) | ORAL | Status: DC | PRN
  Filled 2013-04-03: qty 1

## 2013-04-03 MED ORDER — NALBUPHINE SYRINGE 5 MG/0.5 ML
5.0000 mg | INJECTION | INTRAMUSCULAR | Status: DC | PRN
  Filled 2013-04-03: qty 1

## 2013-04-03 MED ORDER — KETOROLAC TROMETHAMINE 30 MG/ML IJ SOLN: 30.0000 mg | Freq: Four times a day (QID) | INTRAMUSCULAR | Status: AC | PRN

## 2013-04-03 MED ORDER — ONDANSETRON HCL 4 MG/2ML IJ SOLN: 4.0000 mg | Freq: Three times a day (TID) | INTRAMUSCULAR | Status: DC | PRN

## 2013-04-03 MED ORDER — LANOLIN HYDROUS EX OINT: 1.0000 "application " | TOPICAL_OINTMENT | CUTANEOUS | Status: DC | PRN

## 2013-04-03 MED ORDER — NALOXONE HCL 1 MG/ML IJ SOLN
1.0000 ug/kg/h | INTRAVENOUS | Status: DC | PRN
  Filled 2013-04-03: qty 2

## 2013-04-03 MED ORDER — SCOPOLAMINE 1 MG/3DAYS TD PT72
MEDICATED_PATCH | TRANSDERMAL | Status: AC
  Administered 2013-04-03: 1.5 mg via TRANSDERMAL
  Filled 2013-04-03: qty 1

## 2013-04-03 MED ORDER — KETOROLAC TROMETHAMINE 30 MG/ML IJ SOLN: 15.0000 mg | Freq: Once | INTRAMUSCULAR | Status: DC | PRN

## 2013-04-03 MED ORDER — CEFAZOLIN SODIUM-DEXTROSE 2-3 GM-% IV SOLR
INTRAVENOUS | Status: AC
  Filled 2013-04-03: qty 50

## 2013-04-03 MED ORDER — IBUPROFEN 600 MG PO TABS: 600.0000 mg | ORAL_TABLET | Freq: Four times a day (QID) | ORAL | Status: DC | PRN

## 2013-04-03 MED ORDER — ONDANSETRON HCL 4 MG/2ML IJ SOLN
INTRAMUSCULAR | Status: DC | PRN
  Administered 2013-04-03: 4 mg via INTRAVENOUS

## 2013-04-03 MED ORDER — OXYTOCIN 10 UNIT/ML IJ SOLN
INTRAMUSCULAR | Status: AC
  Filled 2013-04-03: qty 4

## 2013-04-03 MED ORDER — HYDROMORPHONE HCL PF 1 MG/ML IJ SOLN
INTRAMUSCULAR | Status: AC
  Filled 2013-04-03: qty 1

## 2013-04-03 MED ORDER — OXYTOCIN 40 UNITS IN LACTATED RINGERS INFUSION - SIMPLE MED: 62.5000 mL/h | INTRAVENOUS | Status: AC

## 2013-04-03 MED ORDER — ONDANSETRON HCL 4 MG/2ML IJ SOLN: 4.0000 mg | INTRAMUSCULAR | Status: DC | PRN

## 2013-04-03 MED ORDER — SCOPOLAMINE 1 MG/3DAYS TD PT72
1.0000 | MEDICATED_PATCH | Freq: Once | TRANSDERMAL | Status: DC
  Administered 2013-04-03: 1.5 mg via TRANSDERMAL

## 2013-04-03 MED ORDER — SIMETHICONE 80 MG PO CHEW
80.0000 mg | CHEWABLE_TABLET | Freq: Three times a day (TID) | ORAL | Status: DC
  Administered 2013-04-03 – 2013-04-05 (×5): 80 mg via ORAL
  Filled 2013-04-03 (×11): qty 1

## 2013-04-03 MED ORDER — PHENYLEPHRINE 8 MG IN D5W 100 ML (0.08MG/ML) PREMIX OPTIME
INJECTION | INTRAVENOUS | Status: AC
  Filled 2013-04-03: qty 100

## 2013-04-03 MED ORDER — LACTATED RINGERS IV SOLN
INTRAVENOUS | Status: DC
  Administered 2013-04-03 (×2): via INTRAVENOUS

## 2013-04-03 MED ORDER — ZOLPIDEM TARTRATE 5 MG PO TABS: 5.0000 mg | ORAL_TABLET | Freq: Every evening | ORAL | Status: DC | PRN

## 2013-04-03 MED ORDER — DIPHENHYDRAMINE HCL 50 MG/ML IJ SOLN
12.5000 mg | INTRAMUSCULAR | Status: DC | PRN
  Administered 2013-04-03: 12.5 mg via INTRAVENOUS
  Filled 2013-04-03: qty 1

## 2013-04-03 MED ORDER — OXYTOCIN 10 UNIT/ML IJ SOLN
40.0000 [IU] | INTRAVENOUS | Status: DC | PRN
  Administered 2013-04-03: 40 [IU] via INTRAVENOUS

## 2013-04-03 MED ORDER — MENTHOL 3 MG MT LOZG
1.0000 | LOZENGE | OROMUCOSAL | Status: DC | PRN
  Filled 2013-04-03: qty 9

## 2013-04-03 MED ORDER — NALBUPHINE SYRINGE 5 MG/0.5 ML
5.0000 mg | INJECTION | INTRAMUSCULAR | Status: DC | PRN
  Administered 2013-04-03 (×2): 10 mg via SUBCUTANEOUS
  Filled 2013-04-03 (×3): qty 1

## 2013-04-03 MED ORDER — CHLOROPROCAINE HCL 3 % IJ SOLN
INTRAMUSCULAR | Status: AC
  Filled 2013-04-03: qty 20

## 2013-04-03 MED ORDER — DIPHENHYDRAMINE HCL 25 MG PO CAPS
25.0000 mg | ORAL_CAPSULE | ORAL | Status: DC | PRN
  Filled 2013-04-03: qty 1

## 2013-04-03 MED ORDER — FENTANYL CITRATE 0.05 MG/ML IJ SOLN
INTRAMUSCULAR | Status: DC | PRN
  Administered 2013-04-03: 100 ug via INTRAVENOUS
  Administered 2013-04-03: 12.5 ug via INTRATHECAL

## 2013-04-03 MED ORDER — DIPHENHYDRAMINE HCL 50 MG/ML IJ SOLN: 25.0000 mg | INTRAMUSCULAR | Status: DC | PRN

## 2013-04-03 MED ORDER — MEPERIDINE HCL 25 MG/ML IJ SOLN: 6.2500 mg | INTRAMUSCULAR | Status: DC | PRN

## 2013-04-03 MED ORDER — SIMETHICONE 80 MG PO CHEW
80.0000 mg | CHEWABLE_TABLET | ORAL | Status: DC
  Administered 2013-04-04 (×2): 80 mg via ORAL
  Filled 2013-04-03 (×4): qty 1

## 2013-04-03 MED ORDER — IBUPROFEN 600 MG PO TABS
600.0000 mg | ORAL_TABLET | Freq: Four times a day (QID) | ORAL | Status: DC
  Administered 2013-04-03 – 2013-04-05 (×7): 600 mg via ORAL
  Filled 2013-04-03 (×7): qty 1

## 2013-04-03 MED ORDER — PROMETHAZINE HCL 25 MG/ML IJ SOLN: 6.2500 mg | INTRAMUSCULAR | Status: DC | PRN

## 2013-04-03 MED ORDER — HYDROMORPHONE HCL PF 1 MG/ML IJ SOLN
0.2500 mg | INTRAMUSCULAR | Status: DC | PRN
  Administered 2013-04-03 (×2): 0.5 mg via INTRAVENOUS

## 2013-04-03 MED ORDER — LACTATED RINGERS IV SOLN
INTRAVENOUS | Status: DC
  Administered 2013-04-03 (×2): via INTRAVENOUS

## 2013-04-03 MED ORDER — SENNOSIDES-DOCUSATE SODIUM 8.6-50 MG PO TABS
2.0000 | ORAL_TABLET | ORAL | Status: DC
  Administered 2013-04-04 (×2): 2 via ORAL
  Filled 2013-04-03 (×4): qty 2

## 2013-04-03 MED ORDER — SIMETHICONE 80 MG PO CHEW
80.0000 mg | CHEWABLE_TABLET | ORAL | Status: DC | PRN
  Filled 2013-04-03: qty 1

## 2013-04-03 MED ORDER — PRENATAL MULTIVITAMIN CH
1.0000 | ORAL_TABLET | Freq: Every day | ORAL | Status: DC
  Administered 2013-04-04: 1 via ORAL
  Filled 2013-04-03 (×3): qty 1

## 2013-04-03 MED ORDER — LIDOCAINE IN DEXTROSE 5-7.5 % IV SOLN: INTRAVENOUS | Status: DC | PRN

## 2013-04-03 MED ORDER — KETOROLAC TROMETHAMINE 30 MG/ML IJ SOLN
30.0000 mg | Freq: Four times a day (QID) | INTRAMUSCULAR | Status: AC | PRN
  Administered 2013-04-03: 30 mg via INTRAVENOUS

## 2013-04-03 MED ORDER — METOCLOPRAMIDE HCL 5 MG/ML IJ SOLN: 10.0000 mg | Freq: Three times a day (TID) | INTRAMUSCULAR | Status: DC | PRN

## 2013-04-03 MED ORDER — LIDOCAINE IN DEXTROSE 5-7.5 % IV SOLN
INTRAVENOUS | Status: DC | PRN
  Administered 2013-04-03: 1 mL via INTRATHECAL

## 2013-04-03 MED ORDER — TETANUS-DIPHTH-ACELL PERTUSSIS 5-2.5-18.5 LF-MCG/0.5 IM SUSP
0.5000 mL | Freq: Once | INTRAMUSCULAR | Status: AC
  Administered 2013-04-04: 0.5 mL via INTRAMUSCULAR
  Filled 2013-04-03 (×2): qty 0.5

## 2013-04-03 MED ORDER — KETOROLAC TROMETHAMINE 30 MG/ML IJ SOLN
INTRAMUSCULAR | Status: AC
  Filled 2013-04-03: qty 1

## 2013-04-03 MED ORDER — ONDANSETRON HCL 4 MG/2ML IJ SOLN
INTRAMUSCULAR | Status: AC
  Filled 2013-04-03: qty 2

## 2013-04-03 MED ORDER — WITCH HAZEL-GLYCERIN EX PADS: 1.0000 "application " | MEDICATED_PAD | CUTANEOUS | Status: DC | PRN

## 2013-04-03 MED ORDER — OXYCODONE-ACETAMINOPHEN 5-325 MG PO TABS
1.0000 | ORAL_TABLET | ORAL | Status: DC | PRN
  Administered 2013-04-04: 1 via ORAL
  Administered 2013-04-04: 2 via ORAL
  Administered 2013-04-05: 1 via ORAL
  Filled 2013-04-03 (×2): qty 1
  Filled 2013-04-03: qty 2

## 2013-04-03 MED ORDER — FENTANYL CITRATE 0.05 MG/ML IJ SOLN
INTRAMUSCULAR | Status: AC
  Filled 2013-04-03: qty 2

## 2013-04-03 MED ORDER — DIBUCAINE 1 % RE OINT
1.0000 "application " | TOPICAL_OINTMENT | RECTAL | Status: DC | PRN
  Filled 2013-04-03: qty 28

## 2013-04-03 MED ORDER — MORPHINE SULFATE 0.5 MG/ML IJ SOLN
INTRAMUSCULAR | Status: AC
  Filled 2013-04-03: qty 10

## 2013-04-03 MED ORDER — CEFAZOLIN SODIUM-DEXTROSE 2-3 GM-% IV SOLR
INTRAVENOUS | Status: AC
  Administered 2013-04-03: 2 g via INTRAVENOUS
  Filled 2013-04-03: qty 50

## 2013-04-03 SURGICAL SUPPLY — 33 items
ADH SKN CLS APL DERMABOND .7 (GAUZE/BANDAGES/DRESSINGS) ×1
CLAMP CORD UMBIL (MISCELLANEOUS) IMPLANT
CLOTH BEACON ORANGE TIMEOUT ST (SAFETY) ×2 IMPLANT
DERMABOND ADVANCED (GAUZE/BANDAGES/DRESSINGS) ×1
DERMABOND ADVANCED .7 DNX12 (GAUZE/BANDAGES/DRESSINGS) ×1 IMPLANT
DRAPE LG THREE QUARTER DISP (DRAPES) IMPLANT
DRSG OPSITE POSTOP 4X10 (GAUZE/BANDAGES/DRESSINGS) ×2 IMPLANT
DURAPREP 26ML APPLICATOR (WOUND CARE) ×2 IMPLANT
ELECT REM PT RETURN 9FT ADLT (ELECTROSURGICAL) ×2
ELECTRODE REM PT RTRN 9FT ADLT (ELECTROSURGICAL) ×1 IMPLANT
EXTRACTOR VACUUM M CUP 4 TUBE (SUCTIONS) ×1 IMPLANT
GLOVE BIO SURGEON STRL SZ8 (GLOVE) ×1 IMPLANT
GLOVE BIO SURGEON STRL SZ8.5 (GLOVE) ×2 IMPLANT
GOWN PREVENTION PLUS XLARGE (GOWN DISPOSABLE) ×3 IMPLANT
GOWN PREVENTION PLUS XXLARGE (GOWN DISPOSABLE) ×2 IMPLANT
KIT ABG SYR 3ML LUER SLIP (SYRINGE) IMPLANT
NDL HYPO 25X5/8 SAFETYGLIDE (NEEDLE) ×1 IMPLANT
NEEDLE HYPO 25X5/8 SAFETYGLIDE (NEEDLE) ×2 IMPLANT
NS IRRIG 1000ML POUR BTL (IV SOLUTION) ×2 IMPLANT
PACK C SECTION WH (CUSTOM PROCEDURE TRAY) ×2 IMPLANT
PAD OB MATERNITY 4.3X12.25 (PERSONAL CARE ITEMS) ×2 IMPLANT
SUT CHROMIC 0 CT 802H (SUTURE) ×2 IMPLANT
SUT CHROMIC 1 CTX 36 (SUTURE) ×4 IMPLANT
SUT CHROMIC 2 0 SH (SUTURE) ×2 IMPLANT
SUT GUT PLAIN 0 CT-3 TAN 27 (SUTURE) IMPLANT
SUT MON AB 4-0 PS1 27 (SUTURE) ×2 IMPLANT
SUT VIC AB 0 CT1 18XCR BRD8 (SUTURE) IMPLANT
SUT VIC AB 0 CT1 8-18 (SUTURE)
SUT VIC AB 0 CTX 36 (SUTURE) ×4
SUT VIC AB 0 CTX36XBRD ANBCTRL (SUTURE) ×2 IMPLANT
TOWEL OR 17X24 6PK STRL BLUE (TOWEL DISPOSABLE) ×2 IMPLANT
TRAY FOLEY CATH 14FR (SET/KITS/TRAYS/PACK) ×2 IMPLANT
WATER STERILE IRR 1000ML POUR (IV SOLUTION) ×1 IMPLANT

## 2013-04-03 NOTE — Anesthesia Preprocedure Evaluation (Signed)
Anesthesia Evaluation  Patient identified by MRN, date of birth, ID band Patient awake    Reviewed: Allergy & Precautions, H&P , NPO status , Patient's Chart, lab work & pertinent test results  Airway Mallampati: I TM Distance: >3 FB Neck ROM: full    Dental no notable dental hx.    Pulmonary neg pulmonary ROS,    Pulmonary exam normal       Cardiovascular negative cardio ROS      Neuro/Psych negative neurological ROS  negative psych ROS   GI/Hepatic negative GI ROS, Neg liver ROS,   Endo/Other  negative endocrine ROS  Renal/GU negative Renal ROS  negative genitourinary   Musculoskeletal negative musculoskeletal ROS (+)   Abdominal Normal abdominal exam  (+)   Peds  Hematology negative hematology ROS (+)   Anesthesia Other Findings   Reproductive/Obstetrics (+) Pregnancy                           Anesthesia Physical Anesthesia Plan  ASA: II  Anesthesia Plan: Spinal   Post-op Pain Management:    Induction:   Airway Management Planned:   Additional Equipment:   Intra-op Plan:   Post-operative Plan:   Informed Consent: I have reviewed the patients History and Physical, chart, labs and discussed the procedure including the risks, benefits and alternatives for the proposed anesthesia with the patient or authorized representative who has indicated his/her understanding and acceptance.     Plan Discussed with: CRNA and Surgeon  Anesthesia Plan Comments:         Anesthesia Quick Evaluation

## 2013-04-03 NOTE — Anesthesia Postprocedure Evaluation (Signed)
  Anesthesia Post-op Note  Patient: Rose [name]  Procedure(s) Performed: Procedure(s) with comments: REPEAT CESAREAN SECTION (N/A) - X2  Patient Location: Mother/Baby  Anesthesia Type:Spinal  Level of Consciousness: awake, alert  and oriented  Airway and Oxygen Therapy: Patient Spontanous Breathing  Post-op Pain: none  Post-op Assessment: Post-op Vital signs reviewed and Patient's Cardiovascular Status Stable  Post-op Vital Signs: Reviewed and stable  Complications: No apparent anesthesia complications

## 2013-04-03 NOTE — Anesthesia Postprocedure Evaluation (Signed)
  Anesthesia Post-op Note  Patient: Rose [name]  Procedure(s) Performed: Procedure(s) with comments: REPEAT CESAREAN SECTION (N/A) - X2  Patient is awake, responsive, moving her legs, and has signs of resolution of her numbness. Pain and nausea are reasonably well controlled. Vital signs are stable and clinically acceptable. Oxygen saturation is clinically acceptable. There are no apparent anesthetic complications at this time. Patient is ready for discharge.

## 2013-04-03 NOTE — Anesthesia Procedure Notes (Signed)
Spinal  Patient location during procedure: OR Start time: 04/03/2013 7:15 AM End time: 04/03/2013 7:18 AM Staffing Anesthesiologist: Leilani Able Performed by: anesthesiologist  Preanesthetic Checklist Completed: patient identified, surgical consent, pre-op evaluation, timeout performed, IV checked, risks and benefits discussed and monitors and equipment checked Spinal Block Patient position: sitting Prep: DuraPrep Patient monitoring: heart rate, cardiac monitor, continuous pulse ox and blood pressure Approach: midline Location: L3-4 Injection technique: single-shot Needle Needle type: Sprotte and Pencan  Needle gauge: 24 G Needle length: 10 cm Needle insertion depth: 4 cm Assessment Sensory level: T4

## 2013-04-03 NOTE — Progress Notes (Signed)
UR completed 

## 2013-04-03 NOTE — Lactation Note (Signed)
This note was copied from the chart of Rose Mileydi Dunshee. Lactation Consultation Note Initial visit at 10 hours of age.  [facility] LC resources given and discussed.  Mom reports using a nipple shield with older child, but nursed for 1 year and only needed nipple shield for 1 month.  Mom has erect nipples at rest and attempting to latch in cradle hold. Demonstrated hand expression with colostrum visible.  Repositioned to football hold on right breast.  Baby latches well with rhythmic suckling and swallows observed. Mom denies pain. Breast feeding basics reviewed including, pillow support, what the latch looks like, cue based feeding and skin to skin.  Mom receptive to teaching and comfortable holding baby for latch.  Mom requests a hand pump to have for later and was given.  Mom to call for assist as needed.   Patient Name: Rose [name] [id number] Date: 04/03/2013 Reason for consult: Initial assessment   Maternal Data Formula Feeding for Exclusion: No Infant to breast within first hour of birth: Yes Has patient been taught Hand Expression?: Yes Does the patient have breastfeeding experience prior to this delivery?: Yes  Feeding Feeding Type: Breast Fed Length of feed:  (observed 10 minutes)  LATCH Score/Interventions Latch: Grasps breast easily, tongue down, lips flanged, rhythmical sucking. Intervention(s): Breast compression;Assist with latch  Audible Swallowing: A few with stimulation Intervention(s): Hand expression  Type of Nipple: Everted at rest and after stimulation  Comfort (Breast/Nipple): Soft / non-tender     Hold (Positioning): No assistance needed to correctly position infant at breast.  LATCH Score: 9  Lactation Tools Discussed/Used Pump Review: Setup, frequency, and cleaning Initiated by:: Franz Dell   Consult Status Consult Status: Follow-up Date: 04/04/13 Follow-up type: In-patient    Jannifer Rodney 04/03/2013, 6:23 PM

## 2013-04-03 NOTE — H&P (Signed)
  There has been no change in the history and physical since the original dictation and and

## 2013-04-03 NOTE — Op Note (Signed)
preop diagnosis previous cesarean section at term Postop diagnosis repeat C-section at term Surgeon Dr. Francoise Ceo First assistant Dr. Coral Ceo Anesthesia spinal Procedure patient placed on the operating table in the supine position after the spinal administered abdomen prepped and draped  Bladder emptied  with a Foley catheter a transverse suprapubic incision made through the old scar carried to the rectus fascia fascia cleaned and incised the length of the incision recti muscles retracted laterally peritoneum incised longitudinally transverse incision made on the visceroperitoneum above the bladder and the bladder mobilized inferiorly transverse low uterine incision made the fluid was clear patient delivered from the  Op . Position of a female Apgar 8 and 9 placenta was anterior removed manually and sent to labor and delivery uterine cavity clean with dry laps uterine incision closed in one layer with continuous  one chromic hemostasis satisfactory bladder flap reattached to a chromic uterus well contracted abdomen chosen as peritoneum continuous within of 0 chromic fascia continuous with of 0 Dexon and the skin shows a subcuticular stitch of 4-0 Monocryl blood glass was 600 cc and and and and

## 2013-04-03 NOTE — Transfer of Care (Signed)
Immediate Anesthesia Transfer of Care Note  Patient: Rose [name]  Procedure(s) Performed: Procedure(s) with comments: REPEAT CESAREAN SECTION (N/A) - X2  Patient Location: PACU  Anesthesia Type:Spinal  Level of Consciousness: awake, alert  and oriented  Airway & Oxygen Therapy: Patient Spontanous Breathing  Post-op Assessment: Report given to PACU RN and Post -op Vital signs reviewed and stable  Post vital signs: Reviewed and stable  Complications: No apparent anesthesia complications

## 2013-04-04 ENCOUNTER — Encounter (HOSPITAL_COMMUNITY): Payer: Self-pay | Admitting: Obstetrics

## 2013-04-04 LAB — CBC
HCT: 25.2 % — ABNORMAL LOW (ref 36.0–46.0)
Hemoglobin: 8.1 g/dL — ABNORMAL LOW (ref 12.0–15.0)
MCH: 26.3 pg (ref 26.0–34.0)
MCHC: 32.1 g/dL (ref 30.0–36.0)
RBC: 3.08 MIL/uL — ABNORMAL LOW (ref 3.87–5.11)
WBC: 11.2 10*3/uL — ABNORMAL HIGH (ref 4.0–10.5)

## 2013-04-04 LAB — BIRTH TISSUE RECOVERY COLLECTION (PLACENTA DONATION)

## 2013-04-04 NOTE — Progress Notes (Signed)
Patient ID: Rose [name], female   DOB: Dec 03, 1990, 22 y.o.   MRN: [id number] Postop day 1 Vital signs normal Fundus firm Lochia moderate Legs negative Doing well

## 2013-04-05 NOTE — Discharge Summary (Signed)
Obstetric Discharge Summary Reason for Admission: induction of labor Prenatal Procedures: none Intrapartum Procedures: cesarean: low cervical, transverse Postpartum Procedures: none Complications-Operative and Postpartum: none Hemoglobin  Date Value Range Status  04/04/2013 8.1* 12.0 - 15.0 g/dL Final     DELTA CHECK NOTED     REPEATED TO VERIFY     HCT  Date Value Range Status  04/04/2013 25.2* 36.0 - 46.0 % Final    Physical Exam:  General: alert Lochia: appropriate Uterine Fundus: firm Incision: healing well DVT Evaluation: No evidence of DVT seen on physical exam.  Discharge Diagnoses: Term Pregnancy-delivered  Discharge Information: Date: 04/05/2013 Activity: pelvic rest Diet: routine Medications: Percocet Condition: stable Instructions: refer to practice specific booklet Discharge to: home Follow-up Information   Schedule an appointment as soon as possible for a visit with Kathreen Cosier, MD.   Specialty:  Obstetrics and Gynecology   Contact information:   [address] [place] [address] [phone]       Newborn Data: Live born female  Birth Weight: 6 lb 2.4 oz (2790 g) APGAR: 8, 9  Home with mother.  Michiel Sivley A 04/05/2013, 5:41 AM

## 2013-04-06 LAB — TYPE AND SCREEN
Antibody Screen: NEGATIVE
Unit division: 0

## 2013-06-13 ENCOUNTER — Emergency Department (HOSPITAL_COMMUNITY)
Admission: EM | Admit: 2013-06-13 | Discharge: 2013-06-14 | Discharge status: Against Medical Advice | Payer: Medicaid Other | Attending: Emergency Medicine | Admitting: Emergency Medicine

## 2013-06-13 ENCOUNTER — Encounter (HOSPITAL_COMMUNITY): Payer: Self-pay | Admitting: Emergency Medicine

## 2013-06-13 DIAGNOSIS — O909 Complication of the puerperium, unspecified: Secondary | ICD-10-CM | POA: Insufficient documentation

## 2013-06-13 NOTE — ED Notes (Signed)
Pt reports redness and drainage from C-section incision.  Denies fevers.  Reports pain when area is touched.  c-section was 2 months ago.  NAD

## 2014-01-15 IMAGING — US US OB COMP LESS 14 WK
1 series · 13 of 28 positions shown · non-contrast
Comparison: None.

CLINICAL DATA: Pain.  History of previous C-sections.  Estimated
gestational age by LMP is 10 weeks 3 days.  Quantitative beta HCG
is [DATE].

OBSTETRIC <14 WK US AND TRANSVAGINAL OB US
TECHNIQUE: Both transabdominal and transvaginal ultrasound
examinations were performed for complete evaluation of the
gestation as well as the maternal uterus, adnexal regions, and
pelvic cul-de-sac.  Transvaginal technique was performed to assess
early pregnancy.

[Series 1: us ob comp less 14 wks · 13 of 35 slices shown]
[im 2/35]
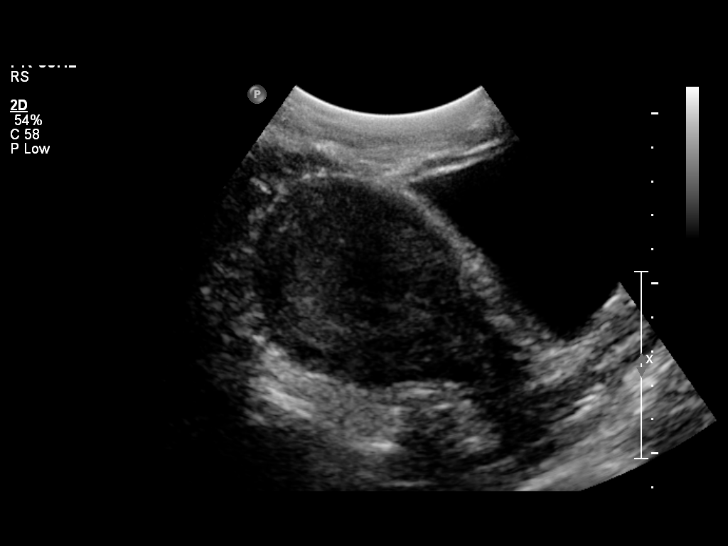
[im 4/35]
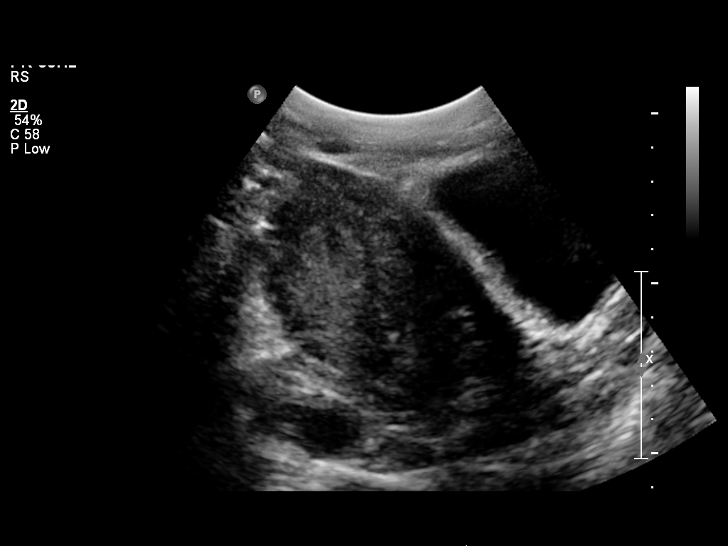
[im 7/35]
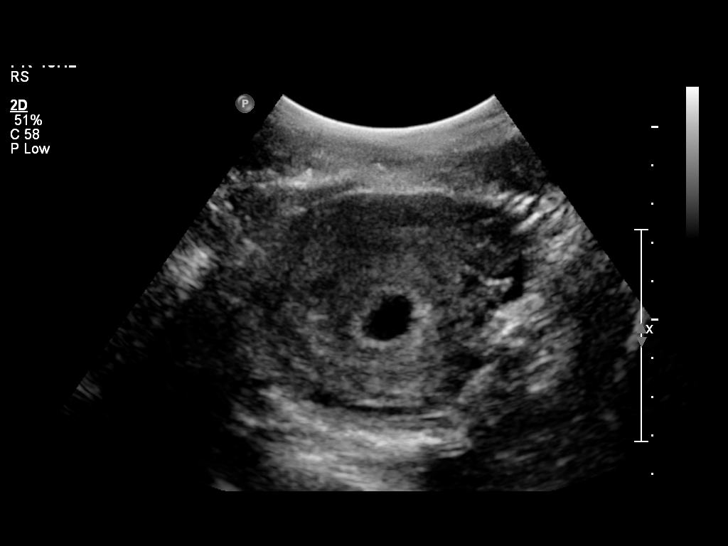
[im 9/35]
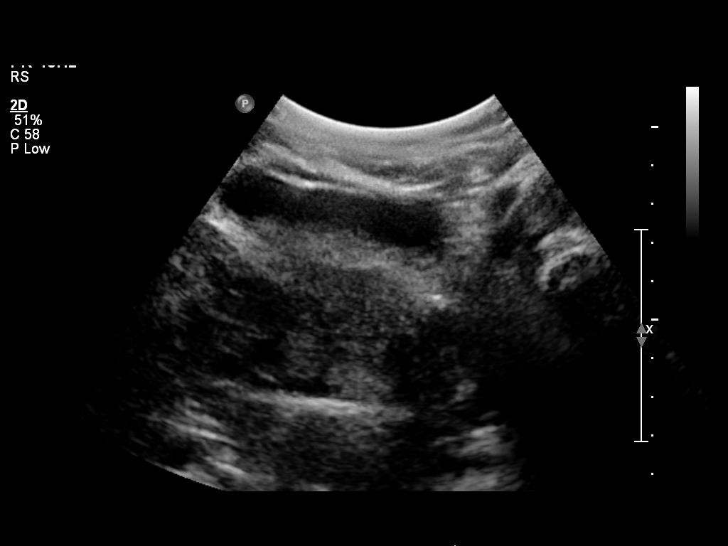
[im 12/35]
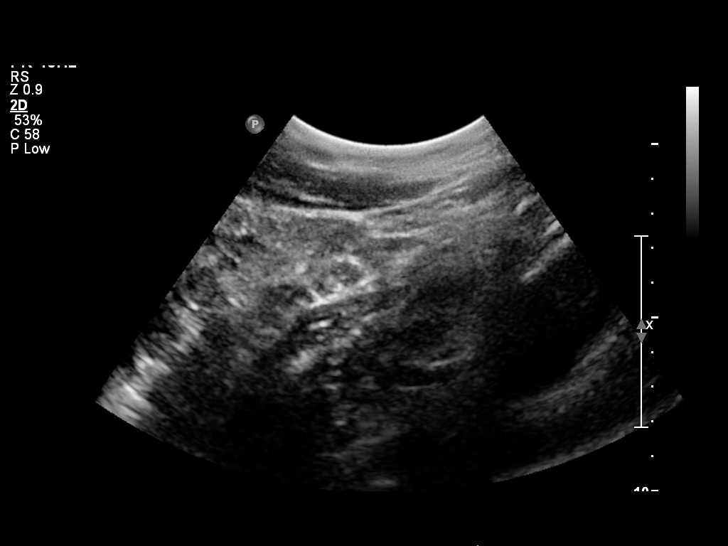
[im 14/35]
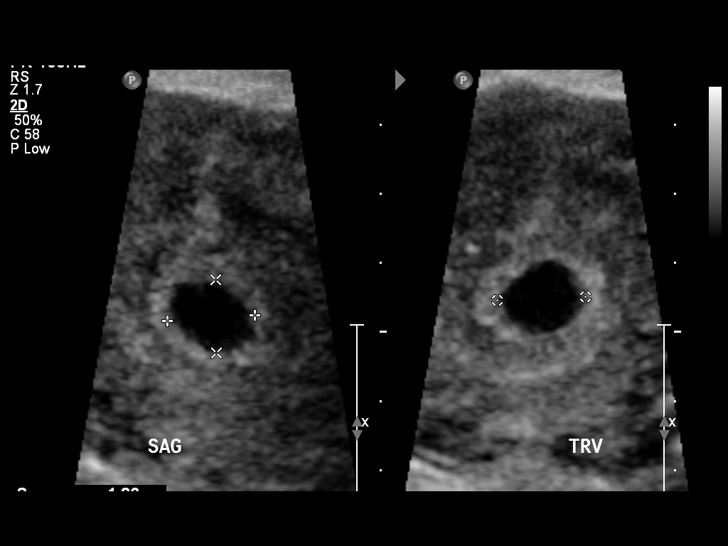
[im 18/35]
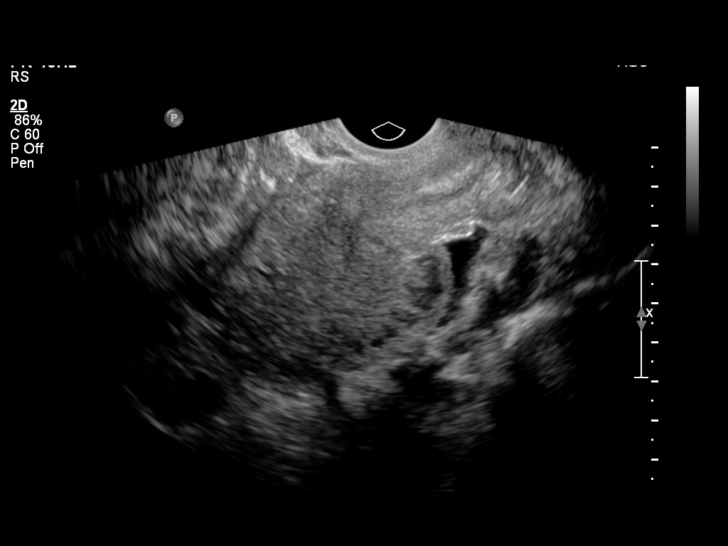
[im 21/35]
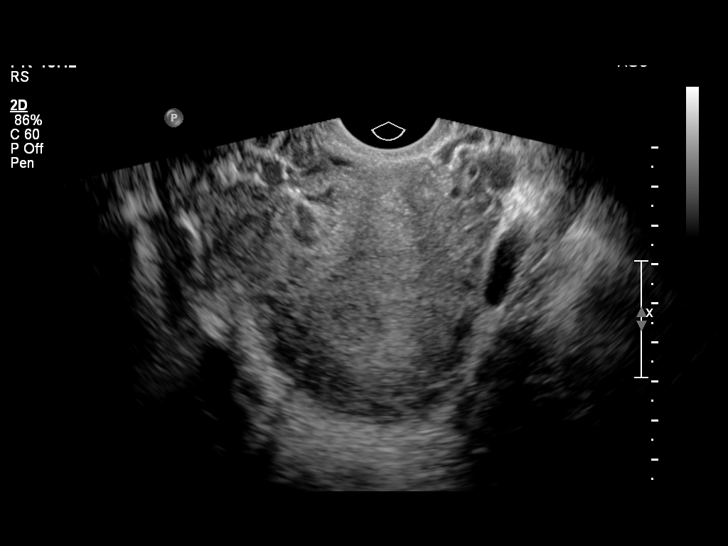
[im 23/35]
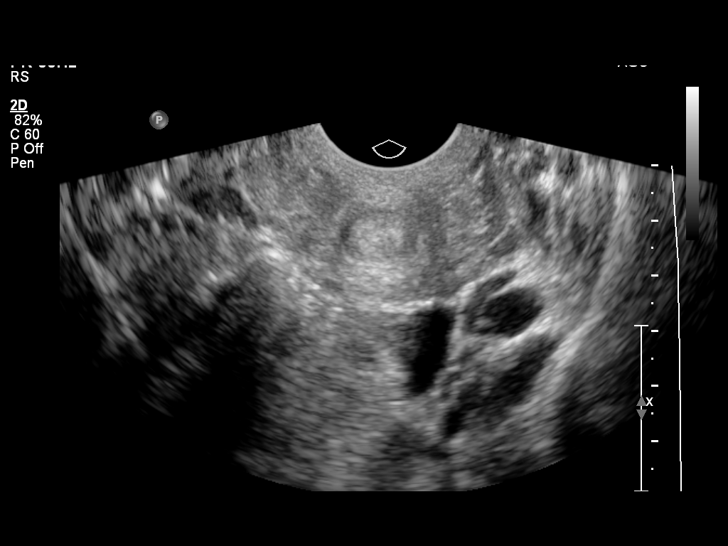
[im 26/35]
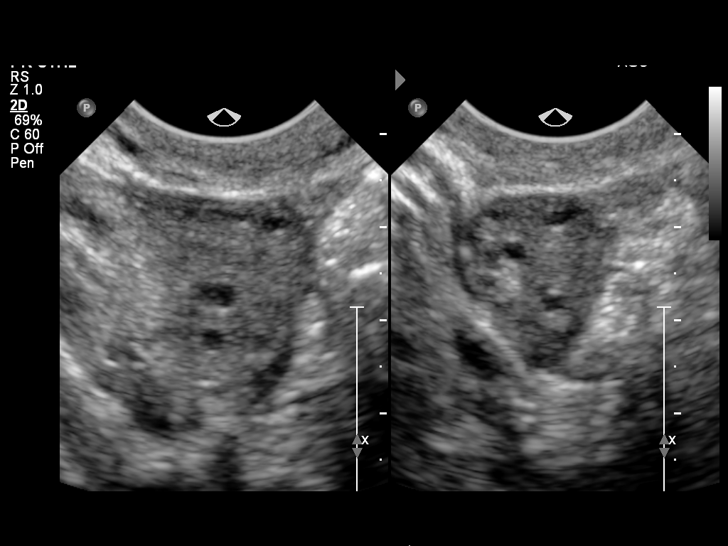
[im 28/35]
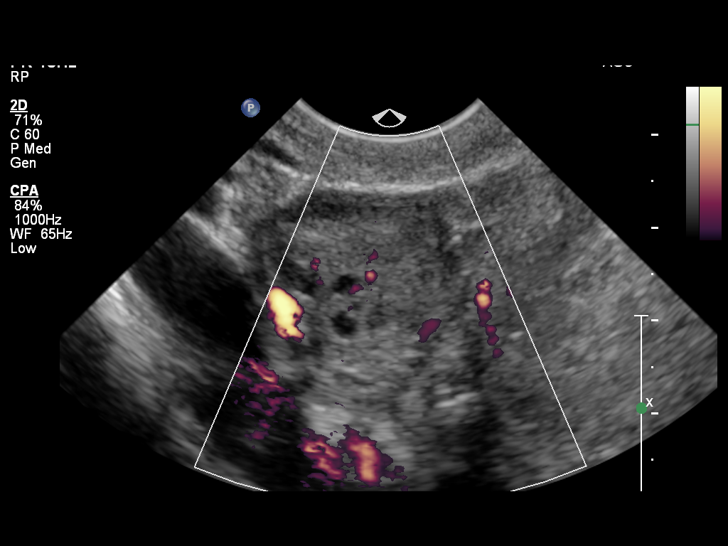
[im 31/35]
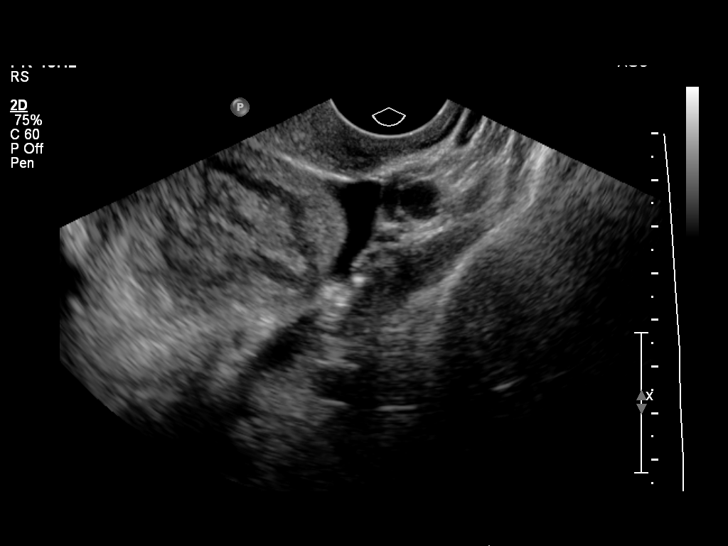
[im 33/35]
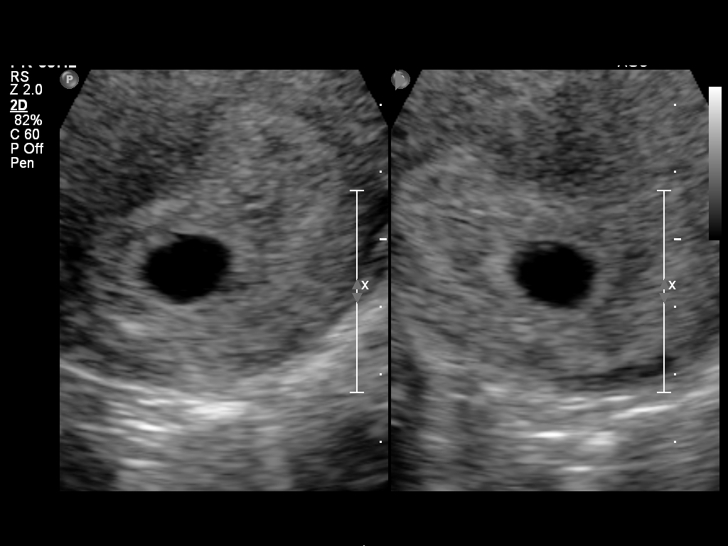

[13 of 28 positions shown; findings below may reference images not displayed]

Intrauterine gestational sac:  A circumscribed single intrauterine
gestational sac is visualized with regular contours.
Yolk sac: The yolk sac is not visualized.
Embryo: The fetal pole is not visualized.
Cardiac Activity: The fetal cardiac activity is not visualized.

MSD: 12.3 mm  6 w 0 d         US EDC: 12/18/2012

Maternal uterus/adnexae:
No myometrial uterine mass lesions identified.  These ovaries
appear symmetrical with normal follicular changes.  Right ovary
measures 2.6 x 1.6 x 1.7 cm.  Left ovary measures 4.1 x 1.9 x 3 cm.
No abnormal adnexal masses.  Small amount of free fluid in the
pelvis.
IMPRESSION: Probable early intrauterine gestational sac, but no yolk sac, fetal
pole, or cardiac activity yet visualized.  Recommend follow-up
quantitative B-HCG levels and follow-up US in 14 days to confirm
and assess viability. This recommendation follows SRU consensus
guidelines: Diagnostic Criteria for Nonviable Pregnancy Early in
the First Trimester.  N Engl J Med 5268; [DATE].

## 2014-02-11 ENCOUNTER — Encounter (HOSPITAL_COMMUNITY): Payer: Self-pay | Admitting: Emergency Medicine

## 2014-05-16 ENCOUNTER — Inpatient Hospital Stay (HOSPITAL_COMMUNITY)
Admission: AD | Admit: 2014-05-16 | Discharge: 2014-05-16 | Disposition: A | Discharge status: Home / Self Care | Payer: Medicaid Other | Source: Ambulatory Visit | Attending: Family Medicine | Admitting: Family Medicine

## 2014-05-16 ENCOUNTER — Inpatient Hospital Stay (HOSPITAL_COMMUNITY): Payer: Medicaid Other

## 2014-05-16 ENCOUNTER — Encounter (HOSPITAL_COMMUNITY): Payer: Self-pay | Admitting: *Deleted

## 2014-05-16 DIAGNOSIS — Z3A01 Less than 8 weeks gestation of pregnancy: Secondary | ICD-10-CM

## 2014-05-16 DIAGNOSIS — O2341 Unspecified infection of urinary tract in pregnancy, first trimester: Secondary | ICD-10-CM | POA: Insufficient documentation

## 2014-05-16 DIAGNOSIS — O26899 Other specified pregnancy related conditions, unspecified trimester: Secondary | ICD-10-CM

## 2014-05-16 DIAGNOSIS — O9989 Other specified diseases and conditions complicating pregnancy, childbirth and the puerperium: Secondary | ICD-10-CM | POA: Diagnosis present

## 2014-05-16 DIAGNOSIS — R109 Unspecified abdominal pain: Secondary | ICD-10-CM | POA: Insufficient documentation

## 2014-05-16 HISTORY — DX: Unspecified infectious disease: B99.9

## 2014-05-16 LAB — URINE MICROSCOPIC-ADD ON

## 2014-05-16 LAB — URINALYSIS, ROUTINE W REFLEX MICROSCOPIC
BILIRUBIN URINE: NEGATIVE
GLUCOSE, UA: NEGATIVE mg/dL
KETONES UR: NEGATIVE mg/dL
Nitrite: NEGATIVE
PH: 7 (ref 5.0–8.0)
Protein, ur: NEGATIVE mg/dL
SPECIFIC GRAVITY, URINE: 1.02 (ref 1.005–1.030)
UROBILINOGEN UA: 1 mg/dL (ref 0.0–1.0)

## 2014-05-16 LAB — CBC
HEMATOCRIT: 37.6 % (ref 36.0–46.0)
HEMOGLOBIN: 12.5 g/dL (ref 12.0–15.0)
MCH: 30.9 pg (ref 26.0–34.0)
MCHC: 33.2 g/dL (ref 30.0–36.0)
MCV: 93.1 fL (ref 78.0–100.0)
PLATELETS: 341 10*3/uL (ref 150–400)
RBC: 4.04 MIL/uL (ref 3.87–5.11)
RDW: 12.1 % (ref 11.5–15.5)
WBC: 7.2 10*3/uL (ref 4.0–10.5)

## 2014-05-16 LAB — WET PREP, GENITAL
CLUE CELLS WET PREP: NONE SEEN
Trich, Wet Prep: NONE SEEN
Yeast Wet Prep HPF POC: NONE SEEN

## 2014-05-16 LAB — POCT PREGNANCY, URINE: Preg Test, Ur: POSITIVE — AB

## 2014-05-16 LAB — HCG, QUANTITATIVE, PREGNANCY: hCG, Beta Chain, Quant, S: 1665 m[IU]/mL — ABNORMAL HIGH (ref <5)

## 2014-05-16 MED ORDER — NITROFURANTOIN MONOHYD MACRO 100 MG PO CAPS: 100.0000 mg | ORAL_CAPSULE | Freq: Two times a day (BID) | ORAL | Status: DC

## 2014-05-16 NOTE — MAU Note (Signed)
Pt reprots she had a positive HPT. C/O cramping  As well. Denies vag bleeding or discharge.

## 2014-05-16 NOTE — MAU Note (Signed)
Cramping in lower abd like period cramps, started a wk or two ago.

## 2014-05-16 NOTE — MAU Provider Note (Signed)
History     CSN: [id number]  Arrival date and time: 05/16/14 1426   None     Chief Complaint  Patient presents with  . Possible Pregnancy   HPI  Pt is a 24 yo G5P3013 at [redacted]w[redacted]d wks pregnancy by LMP Patient's last menstrual period was 04/11/2014.  Pt here with report of positive HPT and c/o mid-pelvic cramping.   Denies vag bleeding or discharge.   Past Medical History  Diagnosis Date  . Urinary tract infection   . Trichomonosis     hx  . Chlamydia   . Infection     UTI    Past Surgical History  Procedure Laterality Date  . Cesarean section  2010  . Cesarean section  02/02/2011    Procedure: CESAREAN SECTION;  Surgeon: Kathreen Cosier, MD;  Location: [facility];  Service: Gynecology;  Laterality: N/A;  Repeat  . Cesarean section N/A 04/03/2013    Procedure: REPEAT CESAREAN SECTION;  Surgeon: Kathreen Cosier, MD;  Location: [facility];  Service: Obstetrics;  Laterality: N/A;  X2    Family History  Problem Relation Age of Onset  . Diabetes Paternal Grandmother   . Hypertension Paternal Grandmother   . Cancer Paternal Grandmother     breast  . Cancer Paternal Grandfather     brain  . Hypertension Mother   . Diabetes Mother   . Hypertension Father   . Diabetes Father   . Heart disease Father   . Hearing loss Neg Hx     History  Substance Use Topics  . Smoking status: Never Smoker   . Smokeless tobacco: Never Used  . Alcohol Use: No    Allergies: No Known Allergies  No prescriptions prior to admission    Review of Systems  Gastrointestinal: Positive for abdominal pain. Negative for nausea and vomiting.  Psychiatric/Behavioral: Suicidal ideas: cramping.  All other systems reviewed and are negative.  Physical Exam   Blood pressure 118/50, pulse 84, temperature 99 F (37.2 C), temperature source Oral, resp. rate 18, height  (1.499 m), weight 74.662 kg (164 lb 9.6 oz), last menstrual period 04/11/2014, unknown if currently breastfeeding.  Physical  Exam  Constitutional: She is oriented to person, place, and time. She appears well-developed and well-nourished. No distress.  HENT:  Head: Normocephalic.  Neck: Normal range of motion. Neck supple.  Cardiovascular: Normal rate, regular rhythm and normal heart sounds.   Respiratory: Effort normal and breath sounds normal. No respiratory distress.  GI: Soft. There is no tenderness.  Genitourinary: Right adnexum displays no mass and no tenderness. Left adnexum displays no mass and no tenderness. No bleeding in the vagina. Vaginal discharge (thin white discharge) found.  Musculoskeletal: Normal range of motion.  Neurological: She is alert and oriented to person, place, and time.  Skin: Skin is warm and dry.   MAU Course  Procedures Results for orders placed or performed during the hospital encounter of 05/16/14 (from the past 24 hour(s))  Urinalysis, Routine w reflex microscopic     Status: Abnormal   Collection Time: 05/16/14  3:30 PM  Result Value Ref Range   Color, Urine YELLOW YELLOW   APPearance CLEAR CLEAR   Specific Gravity, Urine 1.020 1.005 - 1.030   pH 7.0 5.0 - 8.0   Glucose, UA NEGATIVE NEGATIVE mg/dL   Hgb urine dipstick TRACE (A) NEGATIVE   Bilirubin Urine NEGATIVE NEGATIVE   Ketones, ur NEGATIVE NEGATIVE mg/dL   Protein, ur NEGATIVE NEGATIVE mg/dL   Urobilinogen, UA  1.0 0.0 - 1.0 mg/dL   Nitrite NEGATIVE NEGATIVE   Leukocytes, UA LARGE (A) NEGATIVE  Urine microscopic-add on     Status: Abnormal   Collection Time: 05/16/14  3:30 PM  Result Value Ref Range   Squamous Epithelial / LPF MANY (A) RARE   WBC, UA 11-20 <3 WBC/hpf   RBC / HPF 3-6 <3 RBC/hpf   Bacteria, UA MANY (A) RARE   Urine-Other MUCOUS PRESENT   Pregnancy, urine POC     Status: Abnormal   Collection Time: 05/16/14  3:54 PM  Result Value Ref Range   Preg Test, Ur POSITIVE (A) NEGATIVE  CBC     Status: None   Collection Time: 05/16/14  4:50 PM  Result Value Ref Range   WBC 7.2 4.0 - 10.5 K/uL   RBC  4.04 3.87 - 5.11 MIL/uL   Hemoglobin 12.5 12.0 - 15.0 g/dL   HCT 04.537.6 40.936.0 - 81.146.0 %   MCV 93.1 78.0 - 100.0 fL   MCH 30.9 26.0 - 34.0 pg   MCHC 33.2 30.0 - 36.0 g/dL   RDW 91.412.1 78.211.5 - 95.615.5 %   Platelets 341 150 - 400 K/uL  hCG, quantitative, pregnancy     Status: Abnormal   Collection Time: 05/16/14  4:50 PM  Result Value Ref Range   hCG, Beta Chain, Quant, S 1665 (H) <5 mIU/mL   Ultrasound  IMPRESSION: Single intrauterine gestational sac at the fundus of the uterus, with the measured mean sac diameter suggesting a 5 week 3 day conception. No internal contents visualized. The shape is elongated, and a short-term interval follow-up is recommended with repeat lab values and potentially repeat ultrasound to assure viability.  Assessment and Plan  24 yo G5P3013 at 3122w0d wks Pregnancy UTI in Pregnancy  Plan: Discharge to home RX Macrobid 100 mg BID x 7 days Urine culture sent Follow-up BHCG in 48 hours Ectopic precautions  Elenora FenderKARIM, [facility] DivisionWALIDAH N 05/16/2014, 7:25 PM

## 2014-05-17 LAB — GC/CHLAMYDIA PROBE AMP (~~LOC~~) NOT AT ARMC
Chlamydia: POSITIVE — AB
Neisseria Gonorrhea: NEGATIVE

## 2014-05-17 LAB — HIV ANTIBODY (ROUTINE TESTING W REFLEX): HIV Screen 4th Generation wRfx: NONREACTIVE

## 2014-05-18 LAB — CULTURE, OB URINE

## 2014-05-20 ENCOUNTER — Telehealth (HOSPITAL_COMMUNITY): Payer: Self-pay | Admitting: *Deleted

## 2014-05-20 DIAGNOSIS — A749 Chlamydial infection, unspecified: Secondary | ICD-10-CM

## 2014-05-20 DIAGNOSIS — O98811 Other maternal infectious and parasitic diseases complicating pregnancy, first trimester: Principal | ICD-10-CM

## 2014-05-20 MED ORDER — AZITHROMYCIN 500 MG PO TABS: ORAL_TABLET | ORAL | Status: DC

## 2014-05-20 NOTE — Telephone Encounter (Signed)
Telephone call to patient regarding positive chlamydia culture, patient notified.  Rx called in per protocol to patient's pharmacy.  Instructed patient to notify her partner for treatment.  Report faxed to health department.

## 2014-05-22 ENCOUNTER — Inpatient Hospital Stay (HOSPITAL_COMMUNITY): Payer: Medicaid Other

## 2014-05-22 ENCOUNTER — Inpatient Hospital Stay (HOSPITAL_COMMUNITY)
Admission: AD | Admit: 2014-05-22 | Discharge: 2014-05-22 | Disposition: A | Discharge status: Home / Self Care | Payer: Medicaid Other | Source: Ambulatory Visit | Attending: Obstetrics | Admitting: Obstetrics

## 2014-05-22 DIAGNOSIS — O0281 Inappropriate change in quantitative human chorionic gonadotropin (hCG) in early pregnancy: Secondary | ICD-10-CM

## 2014-05-22 DIAGNOSIS — O9989 Other specified diseases and conditions complicating pregnancy, childbirth and the puerperium: Secondary | ICD-10-CM | POA: Insufficient documentation

## 2014-05-22 DIAGNOSIS — Z3A01 Less than 8 weeks gestation of pregnancy: Secondary | ICD-10-CM | POA: Insufficient documentation

## 2014-05-22 DIAGNOSIS — O3680X0 Pregnancy with inconclusive fetal viability, not applicable or unspecified: Secondary | ICD-10-CM

## 2014-05-22 DIAGNOSIS — O3680X1 Pregnancy with inconclusive fetal viability, fetus 1: Secondary | ICD-10-CM

## 2014-05-22 LAB — HCG, QUANTITATIVE, PREGNANCY: hCG, Beta Chain, Quant, S: 19261 m[IU]/mL — ABNORMAL HIGH (ref <5)

## 2014-05-22 NOTE — MAU Note (Signed)
Pt denies any pain or vaginal bleeding

## 2014-05-22 NOTE — MAU Provider Note (Signed)
CSN: [id number]     Arrival date & time 05/22/14  8469 History   None    Chief Complaint  Patient presents with  . Labs Only     (Consider location/radiation/quality/duration/timing/severity/associated sxs/prior Treatment) HPI  Rose [name] is a 24 y.o. female who presents to the MAU for follow up Bhcg and ultrasound. She was evaluated here 05/16/14 and was told to return in 48 hours for Bhcg and then if it was rising we would schedule a follow up ultrasound. The patient did not return until tonight. She denies any problems.   Past Medical History  Diagnosis Date  . Urinary tract infection   . Trichomonosis     hx  . Chlamydia   . Infection     UTI   Past Surgical History  Procedure Laterality Date  . Cesarean section  2010  . Cesarean section  02/02/2011    Procedure: CESAREAN SECTION;  Surgeon: Kathreen Cosier, MD;  Location: [facility];  Service: Gynecology;  Laterality: N/A;  Repeat  . Cesarean section N/A 04/03/2013    Procedure: REPEAT CESAREAN SECTION;  Surgeon: Kathreen Cosier, MD;  Location: [facility];  Service: Obstetrics;  Laterality: N/A;  X2   Family History  Problem Relation Age of Onset  . Diabetes Paternal Grandmother   . Hypertension Paternal Grandmother   . Cancer Paternal Grandmother     breast  . Cancer Paternal Grandfather     brain  . Hypertension Mother   . Diabetes Mother   . Hypertension Father   . Diabetes Father   . Heart disease Father   . Hearing loss Neg Hx    History  Substance Use Topics  . Smoking status: Never Smoker   . Smokeless tobacco: Never Used  . Alcohol Use: No   OB History    Gravida Para Term Preterm AB TAB SAB Ectopic Multiple Living   0 1 0 1 0 0 3     Review of Systems Negative except as stated in HPI   Allergies  Review of patient's allergies indicates no known allergies.  Home Medications   Prior to Admission medications   Medication Sig Start Date End Date Taking? Authorizing Provider   azithromycin (ZITHROMAX) 500 MG tablet Take 2 tablets 05/20/14   Marlis Edelson, CNM  nitrofurantoin, macrocrystal-monohydrate, (MACROBID) 100 MG capsule Take 1 capsule (100 mg total) by mouth 2 (two) times daily. 05/16/14   Marlis Edelson, CNM   BP 123/58 mmHg  Pulse 74  Temp(Src) 98.9 F (37.2 C) (Oral)  Resp 18  Wt 136 lb 6 oz (61.859 kg)  SpO2 100%  LMP 04/11/2014  Breastfeeding? No Physical Exam  Constitutional: She is oriented to person, place, and time. She appears well-developed and well-nourished.  HENT:  Head: Normocephalic.  Eyes: EOM are normal.  Neck: Neck supple.  Cardiovascular: Normal rate.   Pulmonary/Chest: Effort normal.  Musculoskeletal: Normal range of motion.  Neurological: She is alert and oriented to person, place, and time. No cranial nerve deficit.  Psychiatric: She has a normal mood and affect. Her behavior is normal.  Nursing note and vitals reviewed.   ED Course  Procedures (including critical care time) Since the Bhcg has had a normal rise will repeat the ultrasound tonight.  Labs Review Labs Reviewed  HCG, QUANTITATIVE, PREGNANCY - Abnormal; Notable for the following:    hCG, Beta Chain, Quant, S 19261 (*)    All other components within normal limits  Imaging Review [place] Ob Transvaginal  05/22/2014   CLINICAL DATA:  Follow up for viability. Estimated gestational age by LMP is 5 weeks 6 days. Quantitative beta HCG is 19,261, previously 1,665 on 05/16/2014.  EXAM: TRANSVAGINAL OB ULTRASOUND  TECHNIQUE: Transvaginal ultrasound was performed for complete evaluation of the gestation as well as the maternal uterus, adnexal regions, and pelvic cul-de-sac.  COMPARISON:  05/16/2014  FINDINGS: Intrauterine gestational sac: A single intrauterine gestational sac is visualized.  Yolk sac:  Yolk sac is present.  Embryo:  Fetal pole is not identified.  Cardiac Activity: Fetal cardiac activity is not identified.  MSD: 9.6  mm   5 w   4  d                  US EDC:  01/18/2015  Maternal uterus/adnexae: Uterus is retroverted. No myometrial mass lesions identified. Small subchorionic hemorrhage is suggested inferiorly. Right ovary is identified and appears normal. Left ovary is not visualized. No free pelvic fluid.  IMPRESSION: Single intrauterine gestational sac with yolk sac present. Mean sac diameter is consistent with clinical dates and beta HCG is rising. This probably represents an early intrauterine gestational sac. Recommend follow-up quantitative B-HCG levels and follow-up US in 14 days to confirm and assess viability. This recommendation follows SRU consensus guidelines: Diagnostic Criteria for Nonviable Pregnancy Early in the First Trimester. Malva Limes Engl J Med 2013; 010:2725-36; 369:1443-51.   Electronically Signed   By: Burman NievesWilliam  Stevens M.D.   On: 05/22/2014 20:57     MDM  24 y.o. female @ 5854w0d gestation stable for d/c without pain or bleeding. She will start her prenatal care. She will return here as needed for any problems. Discussed with the patient and all questioned fully answered.  Final diagnoses:  Examination to determine fetal viability of pregnancy, fetus 1

## 2014-06-14 ENCOUNTER — Encounter (HOSPITAL_COMMUNITY): Payer: Self-pay | Admitting: Emergency Medicine

## 2014-06-14 ENCOUNTER — Emergency Department (HOSPITAL_COMMUNITY): Payer: Medicaid Other

## 2014-06-14 ENCOUNTER — Emergency Department (HOSPITAL_COMMUNITY)
Admission: EM | Admit: 2014-06-14 | Discharge: 2014-06-15 | Disposition: A | Discharge status: Home / Self Care | Payer: Medicaid Other | Attending: Emergency Medicine | Admitting: Emergency Medicine

## 2014-06-14 DIAGNOSIS — O209 Hemorrhage in early pregnancy, unspecified: Secondary | ICD-10-CM | POA: Insufficient documentation

## 2014-06-14 DIAGNOSIS — O200 Threatened abortion: Secondary | ICD-10-CM | POA: Diagnosis not present

## 2014-06-14 DIAGNOSIS — Y999 Unspecified external cause status: Secondary | ICD-10-CM | POA: Insufficient documentation

## 2014-06-14 DIAGNOSIS — Y92410 Unspecified street and highway as the place of occurrence of the external cause: Secondary | ICD-10-CM | POA: Insufficient documentation

## 2014-06-14 DIAGNOSIS — Z87440 Personal history of urinary (tract) infections: Secondary | ICD-10-CM | POA: Diagnosis not present

## 2014-06-14 DIAGNOSIS — Y939 Activity, unspecified: Secondary | ICD-10-CM | POA: Diagnosis not present

## 2014-06-14 DIAGNOSIS — Z8619 Personal history of other infectious and parasitic diseases: Secondary | ICD-10-CM | POA: Insufficient documentation

## 2014-06-14 DIAGNOSIS — S3991XA Unspecified injury of abdomen, initial encounter: Secondary | ICD-10-CM | POA: Diagnosis not present

## 2014-06-14 DIAGNOSIS — Z3A09 9 weeks gestation of pregnancy: Secondary | ICD-10-CM | POA: Insufficient documentation

## 2014-06-14 DIAGNOSIS — O9A211 Injury, poisoning and certain other consequences of external causes complicating pregnancy, first trimester: Secondary | ICD-10-CM | POA: Insufficient documentation
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
## 2014-06-15 LAB — WET PREP, GENITAL
CLUE CELLS WET PREP: NONE SEEN
Trich, Wet Prep: NONE SEEN
WBC, Wet Prep HPF POC: NONE SEEN
Yeast Wet Prep HPF POC: NONE SEEN
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
## 2014-06-17 LAB — GC/CHLAMYDIA PROBE AMP (~~LOC~~) NOT AT ARMC
Chlamydia: NEGATIVE
Neisseria Gonorrhea: NEGATIVE

## 2015-03-21 ENCOUNTER — Encounter (HOSPITAL_COMMUNITY): Payer: Self-pay | Admitting: *Deleted

## 2016-02-06 IMAGING — US US OB COMP LESS 14 WK
1 series · 13 of 28 positions shown · non-contrast
Comparison: Pelvic ultrasound 05/08/2012, 04/25/2011

CLINICAL DATA: 23-year-old female with a history of abdominal
cramping an first trimester pregnancy.

EXAM:
OBSTETRIC <14 WK US AND TRANSVAGINAL OB US
TECHNIQUE: Both transabdominal and transvaginal ultrasound examinations were
performed for complete evaluation of the gestation as well as the
maternal uterus, adnexal regions, and pelvic cul-de-sac.
Transvaginal technique was performed to assess early pregnancy.

[Series 1: us ob comp less 14 wks · 13 of 57 slices shown]
[im 3/57]
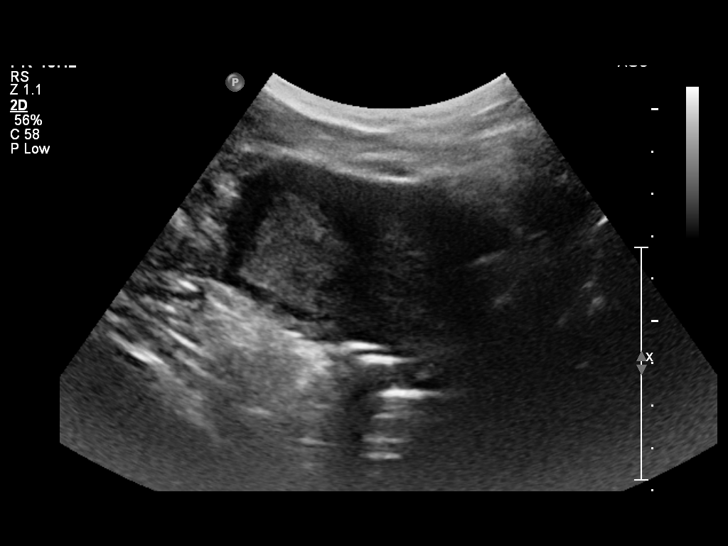
[im 7/57]
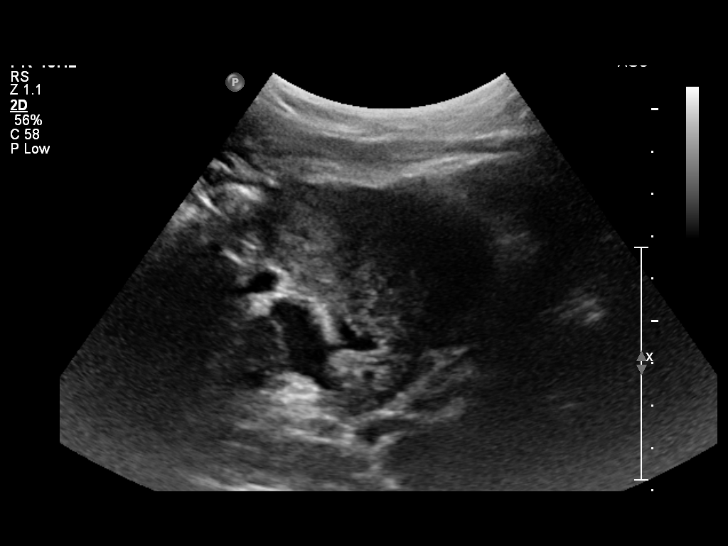
[im 11/57]
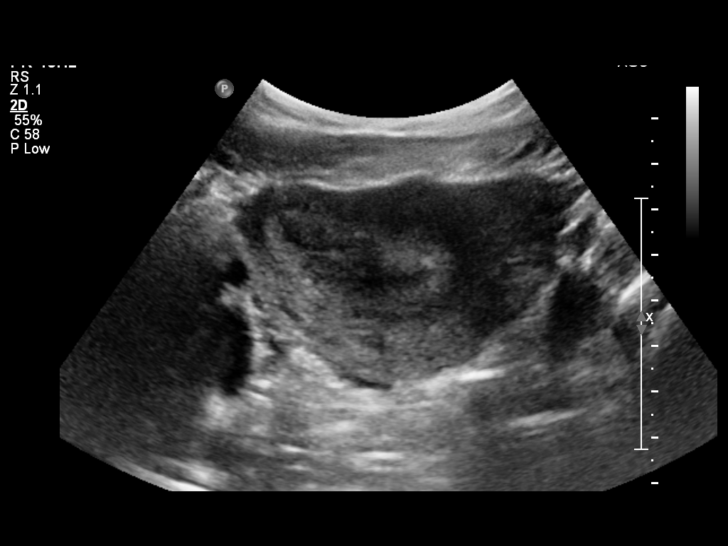
[im 15/57]
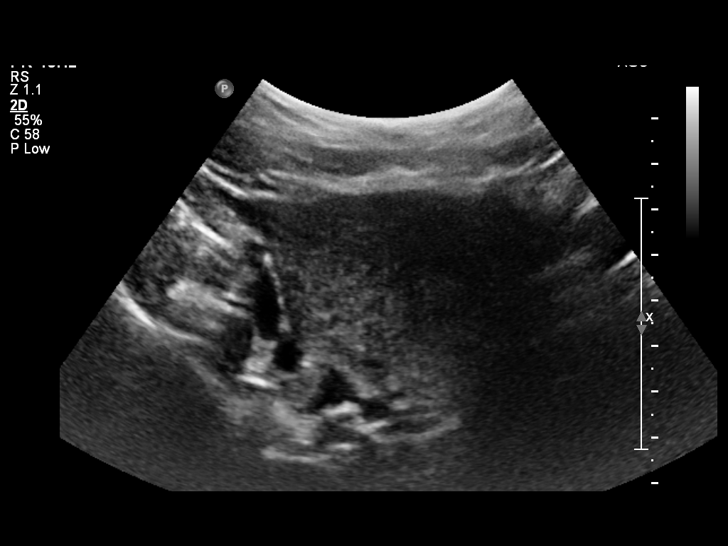
[im 19/57]
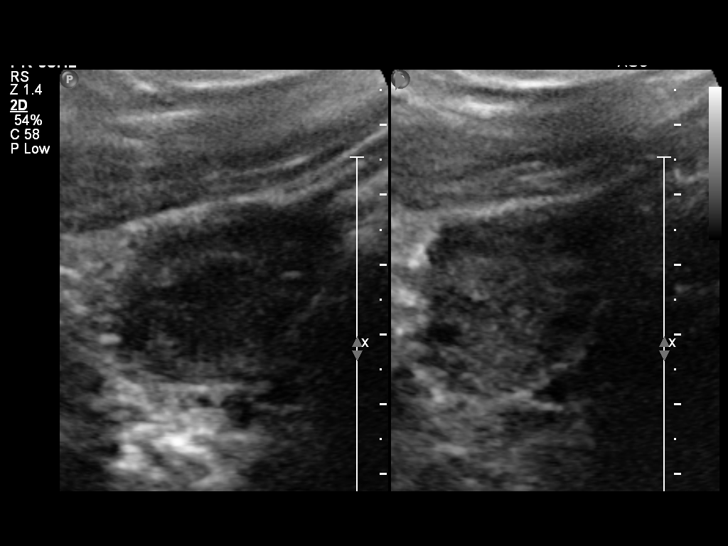
[im 23/57]
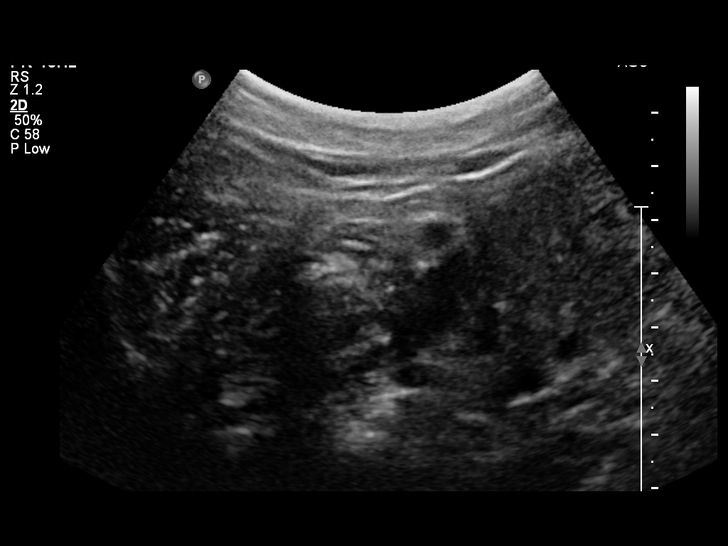
[im 30/57]
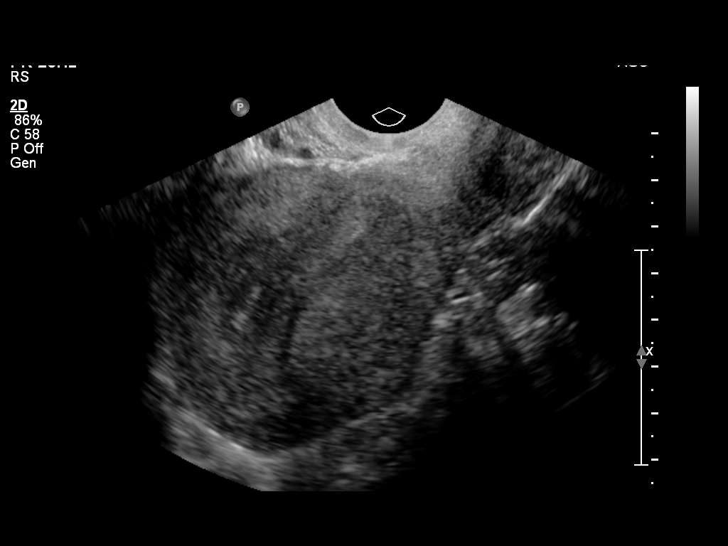
[im 34/57]
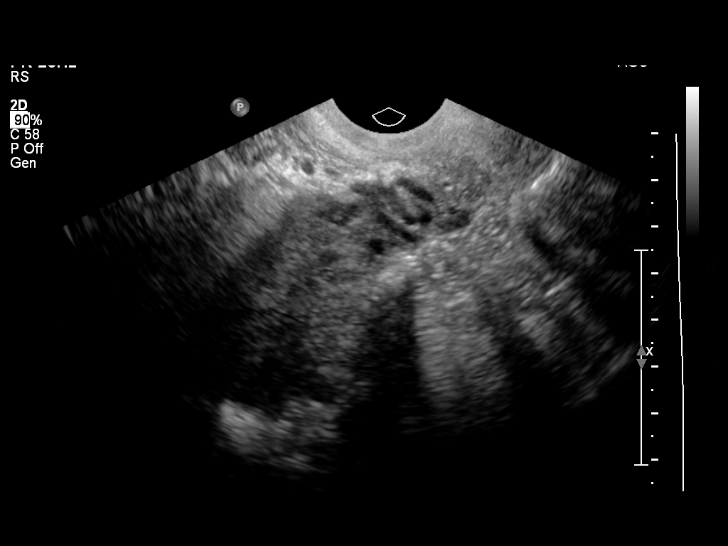
[im 38/57]
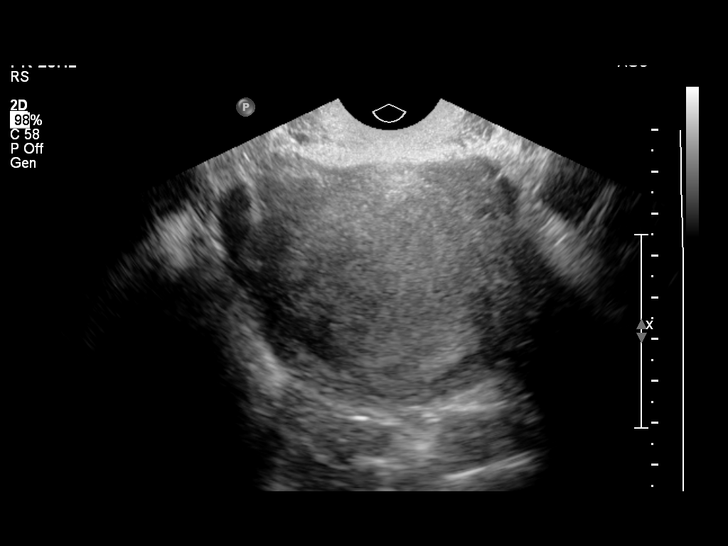
[im 42/57]
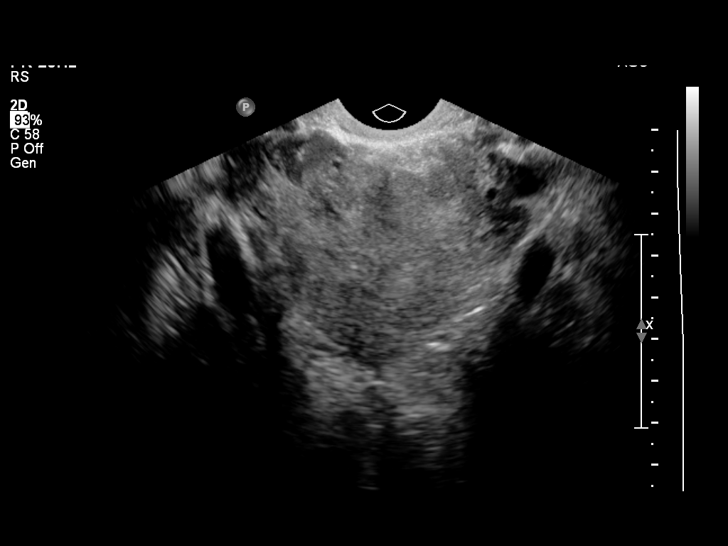
[im 46/57]
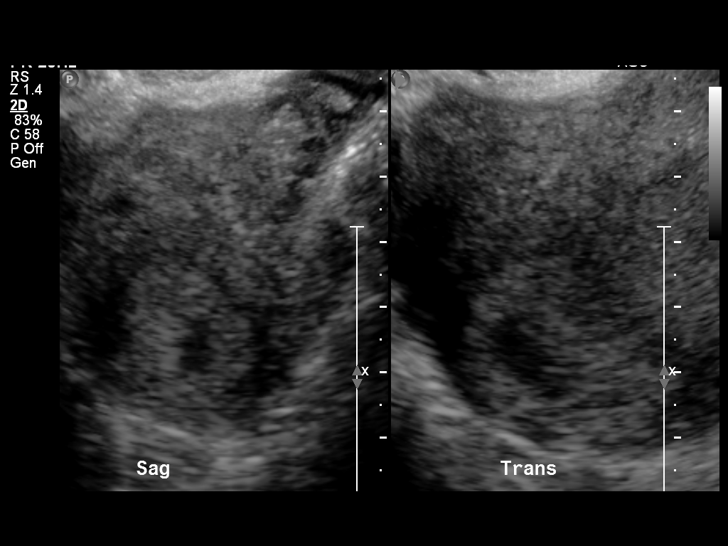
[im 50/57]
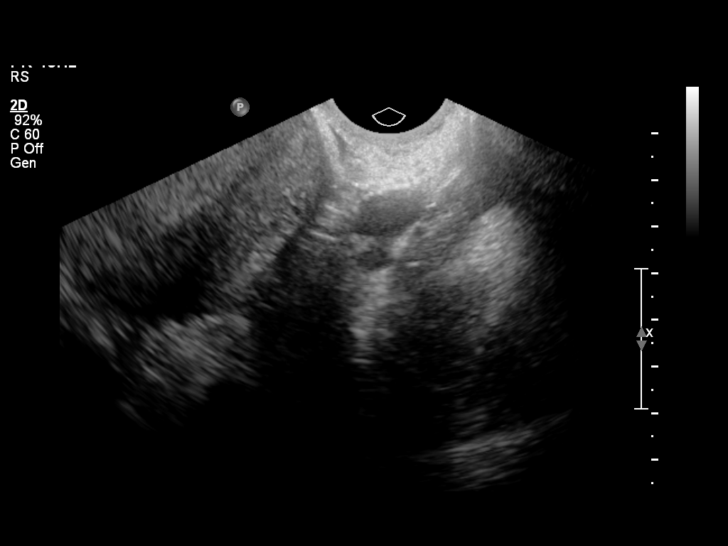
[im 54/57]
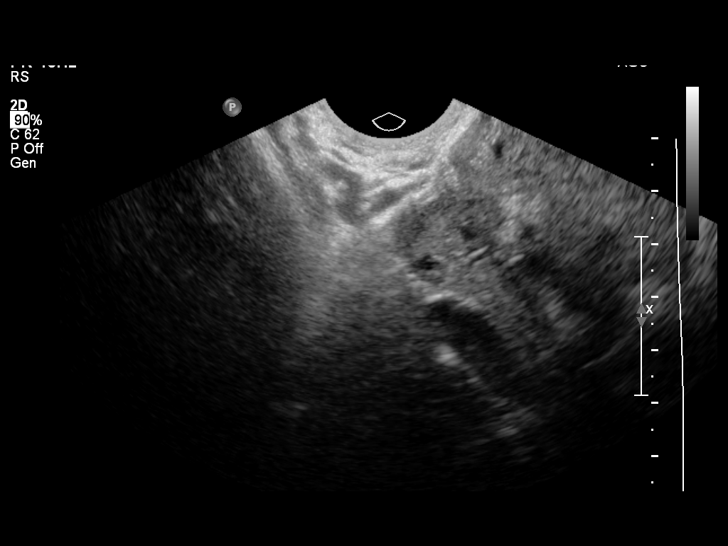

[13 of 28 positions shown; findings below may reference images not displayed]

FINDINGS: Intrauterine gestational sac: Small intrauterine gestational sac at
the fundus of the uterus. The shape is elongated. Gestational sac
measures 9 mm x 5 mm x 9 mm, with mean sac diameter of 0.76 cm.

Yolk sac:  None visualized

Embryo:  None visualized

Cardiac Activity: NA

MSD: 7.6  mm   5 w   3  d

Chest-small age by last menstrual period: 5 week 0 days

Maternal uterus/adnexae: Anteverted uterus.

Unremarkable appearance of the right adnexa.

The left ovary is visualized via the transabdominal ultrasound.
There is a focal hypoechoic region within the left ovary with
circumferential flow, favored to represent the corpus luteal
cyst/corpus luteum.
IMPRESSION: Single intrauterine gestational sac at the fundus of the uterus,
with the measured mean sac diameter suggesting a 5 week 3 day
conception. No internal contents visualized. The shape is elongated,
and a short-term interval follow-up is recommended with repeat lab
values and potentially repeat ultrasound to assure viability.
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
## 2016-04-11 ENCOUNTER — Encounter (HOSPITAL_COMMUNITY): Payer: Self-pay | Admitting: *Deleted

## 2016-04-11 ENCOUNTER — Inpatient Hospital Stay (HOSPITAL_COMMUNITY): Payer: Medicaid Other

## 2016-04-11 ENCOUNTER — Inpatient Hospital Stay (HOSPITAL_COMMUNITY)
Admission: AD | Admit: 2016-04-11 | Discharge: 2016-04-11 | Disposition: A | Discharge status: Home / Self Care | Payer: Medicaid Other | Source: Ambulatory Visit | Attending: Obstetrics and Gynecology | Admitting: Obstetrics and Gynecology

## 2016-04-11 DIAGNOSIS — O208 Other hemorrhage in early pregnancy: Secondary | ICD-10-CM | POA: Diagnosis present

## 2016-04-11 DIAGNOSIS — Z8249 Family history of ischemic heart disease and other diseases of the circulatory system: Secondary | ICD-10-CM | POA: Insufficient documentation

## 2016-04-11 DIAGNOSIS — Z833 Family history of diabetes mellitus: Secondary | ICD-10-CM | POA: Diagnosis not present

## 2016-04-11 DIAGNOSIS — A5901 Trichomonal vulvovaginitis: Secondary | ICD-10-CM | POA: Diagnosis not present

## 2016-04-11 DIAGNOSIS — O209 Hemorrhage in early pregnancy, unspecified: Secondary | ICD-10-CM | POA: Diagnosis present

## 2016-04-11 DIAGNOSIS — O4692 Antepartum hemorrhage, unspecified, second trimester: Secondary | ICD-10-CM

## 2016-04-11 DIAGNOSIS — Z803 Family history of malignant neoplasm of breast: Secondary | ICD-10-CM | POA: Diagnosis not present

## 2016-04-11 DIAGNOSIS — O98312 Other infections with a predominantly sexual mode of transmission complicating pregnancy, second trimester: Secondary | ICD-10-CM | POA: Insufficient documentation

## 2016-04-11 DIAGNOSIS — Z3A09 9 weeks gestation of pregnancy: Secondary | ICD-10-CM | POA: Insufficient documentation

## 2016-04-11 DIAGNOSIS — O34219 Maternal care for unspecified type scar from previous cesarean delivery: Secondary | ICD-10-CM | POA: Diagnosis present

## 2016-04-11 DIAGNOSIS — Z79899 Other long term (current) drug therapy: Secondary | ICD-10-CM | POA: Diagnosis not present

## 2016-04-11 DIAGNOSIS — Z808 Family history of malignant neoplasm of other organs or systems: Secondary | ICD-10-CM | POA: Diagnosis not present

## 2016-04-11 DIAGNOSIS — O2342 Unspecified infection of urinary tract in pregnancy, second trimester: Secondary | ICD-10-CM | POA: Diagnosis not present

## 2016-04-11 DIAGNOSIS — Z9889 Other specified postprocedural states: Secondary | ICD-10-CM | POA: Diagnosis not present

## 2016-04-11 LAB — URINALYSIS, ROUTINE W REFLEX MICROSCOPIC
Bilirubin Urine: NEGATIVE
GLUCOSE, UA: NEGATIVE mg/dL
Hgb urine dipstick: NEGATIVE
KETONES UR: NEGATIVE mg/dL
Nitrite: NEGATIVE
Protein, ur: NEGATIVE mg/dL
SPECIFIC GRAVITY, URINE: 1.017 (ref 1.005–1.030)
pH: 6 (ref 5.0–8.0)

## 2016-04-11 LAB — WET PREP, GENITAL
CLUE CELLS WET PREP: NONE SEEN
Sperm: NONE SEEN
Yeast Wet Prep HPF POC: NONE SEEN

## 2016-04-11 MED ORDER — METRONIDAZOLE 500 MG PO TABS: 500.0000 mg | ORAL_TABLET | Freq: Two times a day (BID) | ORAL | 0 refills | Status: AC

## 2016-04-11 MED ORDER — PRENATAL VITAMIN PLUS LOW IRON 27-1 MG PO TABS: 1.0000 | ORAL_TABLET | ORAL | 5 refills | Status: AC

## 2016-04-11 NOTE — MAU Note (Signed)
Pt states she just found out she was pregnant and she had some bleeding 2-3 days ago.

## 2016-04-11 NOTE — MAU Provider Note (Signed)
History     CSN: [id number]  Arrival date and time: 04/11/16 1918   First Provider Initiated Contact with Patient 04/11/16 2004      No chief complaint on file.  Rose [name] is a 25 y.o. [id number] at 7223w3d by unsure LMP presenting with vaginal bleeding 2-3 days ago. States she stopped OCPs in October before LMP 02/05/16. Had one episode light pink spotting in November. The recent episode was heavier, intermittent for a day and brownish. No antecedent intercourse. Used Monistat for 3 days last week for what she thought was UTI. Denies irritative vaginal discharge. Had right sided fleeting pain at level of umbilicus earlier today, but no other abdominal pain or cramps.  B pos.  Hx significant for C/S x4. [facility] [place] [facility]    NPC  OB History  Gravida Para Term Preterm AB Living  6 4 4  0 1 4  SAB TAB Ectopic Multiple Live Births  1 0 0 0 4    # Outcome Date GA Lbr Len/2nd Weight Sex Delivery Anes PTL Lv  6 Current           5 Term 2016     CS-LTranv   LIV  4 Term 04/03/13 6919w5d  2.79 kg (6 lb 2.4 oz) M CS-Vac Spinal  LIV  3 SAB 2014          2 Term 02/02/11 3335w2d  3.27 kg (7 lb 3.3 oz) F CS-Vac Spinal  LIV     Birth Comments: scheduled repeat  1 Term 2010 8836w0d  4.139 kg (9 lb 2 oz) M CS-LTranv   LIV     Birth Comments: transverse lie       Past Medical History:  Diagnosis Date  . Chlamydia   . Infection    UTI  . Trichomonosis    hx  . Urinary tract infection     Past Surgical History:  Procedure Laterality Date  . CESAREAN SECTION  2010  . CESAREAN SECTION  02/02/2011   Procedure: CESAREAN SECTION;  Surgeon: Kathreen CosierBernard A Marshall, MD;  Location: [facility];  Service: Gynecology;  Laterality: N/A;  Repeat  . CESAREAN SECTION N/A 04/03/2013   Procedure: REPEAT CESAREAN SECTION;  Surgeon: Kathreen CosierBernard A Marshall, MD;  Location: [facility];  Service: Obstetrics;  Laterality: N/A;  X2    Family History  Problem Relation Age of Onset  . Diabetes Paternal  Grandmother   . Hypertension Paternal Grandmother   . Cancer Paternal Grandmother     breast  . Cancer Paternal Grandfather     brain  . Hypertension Father   . Diabetes Father   . Heart disease Father   . Hypertension Mother   . Diabetes Mother   . Hearing loss Neg Hx     Social History  Substance Use Topics  . Smoking status: Never Smoker  . Smokeless tobacco: Never Used  . Alcohol use No    Allergies: No Known Allergies  Prescriptions Prior to Admission  Medication Sig Dispense Refill Last Dose  . azithromycin (ZITHROMAX) 500 MG tablet Take 2 tablets (Patient not taking: Reported on 06/14/2014) 2 tablet 0   . nitrofurantoin, macrocrystal-monohydrate, (MACROBID) 100 MG capsule Take 1 capsule (100 mg total) by mouth 2 (two) times daily. (Patient not taking: Reported on 06/14/2014) 14 capsule 0     Review of Systems  Constitutional: Negative for chills and fever.  Gastrointestinal: Positive for nausea. Negative for blood in stool, constipation, diarrhea, heartburn and  vomiting.  Genitourinary: Negative for dysuria, flank pain, frequency, hematuria and urgency.  Neurological: Negative for dizziness.  Psychiatric/Behavioral: Negative for depression.   Physical Exam   Blood pressure 126/65, pulse 76, temperature 98.5 F (36.9 C), temperature source Oral, resp. rate 17, height 4\' 11"  (1.499 m), weight 61.7 kg (136 lb), last menstrual period 02/05/2016, SpO2 100 %, unknown if currently breastfeeding.  Physical Exam  Nursing note and vitals reviewed. Constitutional: She is oriented to person, place, and time. She appears well-developed and well-nourished. No distress.  HENT:  Head: Normocephalic.  Eyes: Pupils are equal, round, and reactive to light.  Neck: Normal range of motion.  Cardiovascular: Normal rate.   Respiratory: Effort normal.  GI: Soft. There is no tenderness.  DT FHR 150  Genitourinary:  Genitourinary Comments: NEFG Vaginal with homogenous white discharge.  No blood seen. Cx thick, nulliparous, not friable. Uterus 12-14 wk size  Neurological: She is alert and oriented to person, place, and time.  Skin: Skin is warm and dry.  Psychiatric: She has a normal mood and affect.    MAU Course  Procedures Results for orders placed or performed during the hospital encounter of 04/11/16 (from the past 24 hour(s))  Urinalysis, Routine w reflex microscopic     Status: Abnormal   Collection Time: 04/11/16  7:31 PM  Result Value Ref Range   Color, Urine YELLOW YELLOW   APPearance HAZY (A) CLEAR   Specific Gravity, Urine 1.017 1.005 - 1.030   pH 6.0 5.0 - 8.0   Glucose, UA NEGATIVE NEGATIVE mg/dL   Hgb urine dipstick NEGATIVE NEGATIVE   Bilirubin Urine NEGATIVE NEGATIVE   Ketones, ur NEGATIVE NEGATIVE mg/dL   Protein, ur NEGATIVE NEGATIVE mg/dL   Nitrite NEGATIVE NEGATIVE   Leukocytes, UA MODERATE (A) NEGATIVE   RBC / HPF 0-5 0 - 5 RBC/hpf   WBC, UA TOO NUMEROUS TO COUNT 0 - 5 WBC/hpf   Bacteria, UA RARE (A) NONE SEEN   Squamous Epithelial / LPF 6-30 (A) NONE SEEN   Mucous PRESENT   Wet prep, genital     Status: Abnormal   Collection Time: 04/11/16  8:20 PM  Result Value Ref Range   Yeast Wet Prep HPF POC NONE SEEN NONE SEEN   Trich, Wet Prep PRESENT (A) NONE SEEN   Clue Cells Wet Prep HPF POC NONE SEEN NONE SEEN   WBC, Wet Prep HPF POC FEW (A) NONE SEEN   Sperm NONE SEEN   C&S sent GC/CT, HIV sent  US Ob Comp Less 14 Wks  Result Date: 04/11/2016 CLINICAL DATA:  Pregnant with vaginal bleeding. EXAM: OBSTETRIC <14 WK ULTRASOUND TECHNIQUE: Transabdominal ultrasound was performed for evaluation of the gestation as well as the maternal uterus and adnexal regions. COMPARISON:  None. FINDINGS: Intrauterine gestational sac: Present Yolk sac:  Present Embryo:  Present Cardiac Activity: Present Heart Rate: 162 bpm CRL:   27.8  mm   9 w 4 d                  [place] EDC: 11/10/2016 Subchorionic hemorrhage:  Moderate Maternal uterus/adnexae: Normal ovaries.  IMPRESSION: Single living intrauterine fetus estimated at 9 weeks and 4 days gestation. Moderate subchorionic hemorrhage. Normal ovaries. Electronically Signed   By: Rudie Meyer M.D.   On: 04/11/2016 20:57      Assessment and Plan  [id number] at [redacted]w[redacted]d 1. Vaginal bleeding in pregnancy, second trimester   2. Uterine scar from previous cesarean delivery affecting pregnancy   3. Bleeding  in early pregnancy   4. Trichomonal vaginitis    Allergies as of 04/11/2016   No Known Allergies     Medication List    STOP taking these medications   azithromycin 500 MG tablet Commonly known as:  ZITHROMAX   nitrofurantoin (macrocrystal-monohydrate) 100 MG capsule Commonly known as:  MACROBID     TAKE these medications   metroNIDAZOLE 500 MG tablet Commonly known as:  FLAGYL Take 1 tablet (500 mg total) by mouth 2 (two) times daily.   PRENATAL VITAMIN PLUS LOW IRON 27-1 MG Tabs Take 1 tablet by mouth 1 day or 1 dose.      Follow-up Information    [facility] Schedule an appointment as soon as possible for a visit in 1 week(s).   Contact information: [address] [place] [id number] [phone]          Ashantia Amaral 04/11/2016, 8:05 PM

## 2016-04-11 NOTE — Discharge Instructions (Signed)
Vaginal Bleeding During Pregnancy, Second Trimester °A small amount of bleeding (spotting) from the vagina is common in pregnancy. Sometimes the bleeding is normal and is not a problem, and sometimes it is a sign of something serious. Be sure to tell your doctor about any bleeding from your vagina right away. °Follow these instructions at home: °· Watch your condition for any changes. °· Follow your doctor's instructions about how active you can be. °· If you are on bed rest: °¨ You may need to stay in bed and only get up to use the bathroom. °¨ You may be allowed to do some activities. °¨ If you need help, make plans for someone to help you. °· Write down: °¨ The number of pads you use each day. °¨ How often you change pads. °¨ How soaked (saturated) your pads are. °· Do not use tampons. °· Do not douche. °· Do not have sex or orgasms until your doctor says it is okay. °· If you pass any tissue from your vagina, save the tissue so you can show it to your doctor. °· Only take medicines as told by your doctor. °· Do not take aspirin because it can make you bleed. °· Do not exercise, lift heavy weights, or do any activities that take a lot of energy and effort unless your doctor says it is okay. °· Keep all follow-up visits as told by your doctor. °Contact a doctor if: °· You bleed from your vagina. °· You have cramps. °· You have labor pains. °· You have a fever that does not go away after you take medicine. °Get help right away if: °· You have very bad cramps in your back or belly (abdomen). °· You have contractions. °· You have chills. °· You pass large clots or tissue from your vagina. °· You bleed more. °· You feel light-headed or weak. °· You pass out (faint). °· You are leaking fluid or have a gush of fluid from your vagina. °This information is not intended to replace advice given to you by your health care provider. Make sure you discuss any questions you have with your health care provider. °Document  Released: 08/13/2013 Document Revised: 09/04/2015 Document Reviewed: 12/04/2012 °Elsevier Interactive Patient Education © 2017 [employer] Inc[employer] ° °

## 2016-04-12 LAB — HIV ANTIBODY (ROUTINE TESTING W REFLEX): HIV SCREEN 4TH GENERATION: NONREACTIVE

## 2016-04-13 LAB — GC/CHLAMYDIA PROBE AMP (~~LOC~~) NOT AT ARMC
Chlamydia: NEGATIVE
NEISSERIA GONORRHEA: NEGATIVE

## 2016-04-13 LAB — CULTURE, OB URINE

## 2016-04-19 NOTE — MAU Note (Signed)
Patient called for G/C lab results.

## 2016-06-11 LAB — PREGNANCY, URINE: Preg Test, Ur: POSITIVE

## 2016-06-16 ENCOUNTER — Encounter: Payer: Self-pay | Admitting: *Deleted

## 2016-06-17 ENCOUNTER — Encounter: Payer: Self-pay | Admitting: Obstetrics & Gynecology

## 2016-06-17 ENCOUNTER — Ambulatory Visit (INDEPENDENT_AMBULATORY_CARE_PROVIDER_SITE_OTHER): Payer: Medicaid Other | Admitting: Obstetrics & Gynecology

## 2016-06-17 VITALS — BP 112/59 | HR 79 | Wt 133.1 lb

## 2016-06-17 DIAGNOSIS — Z23 Encounter for immunization: Secondary | ICD-10-CM | POA: Diagnosis not present

## 2016-06-17 DIAGNOSIS — O0990 Supervision of high risk pregnancy, unspecified, unspecified trimester: Secondary | ICD-10-CM

## 2016-06-17 DIAGNOSIS — Z3490 Encounter for supervision of normal pregnancy, unspecified, unspecified trimester: Secondary | ICD-10-CM

## 2016-06-17 DIAGNOSIS — Z113 Encounter for screening for infections with a predominantly sexual mode of transmission: Secondary | ICD-10-CM

## 2016-06-17 DIAGNOSIS — Z3482 Encounter for supervision of other normal pregnancy, second trimester: Secondary | ICD-10-CM | POA: Diagnosis present

## 2016-06-17 DIAGNOSIS — O34219 Maternal care for unspecified type scar from previous cesarean delivery: Secondary | ICD-10-CM | POA: Diagnosis not present

## 2016-06-17 DIAGNOSIS — Z124 Encounter for screening for malignant neoplasm of cervix: Secondary | ICD-10-CM | POA: Diagnosis not present

## 2016-06-17 DIAGNOSIS — Z3480 Encounter for supervision of other normal pregnancy, unspecified trimester: Secondary | ICD-10-CM

## 2016-06-17 MED ORDER — PRENATAL VITAMINS 0.8 MG PO TABS: 1.0000 | ORAL_TABLET | Freq: Every day | ORAL | 12 refills | Status: AC

## 2016-06-17 NOTE — Patient Instructions (Signed)

## 2016-06-17 NOTE — Progress Notes (Signed)
New ob packet given  Flu vaccine given  Anatomy US scheduled for March 20th @ 1100.  Pt notified.  Home Medicaid Form Completed

## 2016-06-17 NOTE — Progress Notes (Signed)
Subjective:    Rose SableDesirae A [name] is a [id number] 8433w0d being seen today for her first obstetrical visit.  Her obstetrical history is significant for 4 previous cesarean. Patient does intend to breast feed. Pregnancy history fully reviewed.  Patient reports nausea.  Vitals:   06/17/16 0750  BP: (!) 112/59  Pulse: 79  Weight: 133 lb 1.6 oz (60.4 kg)    HISTORY: OB History  Gravida Para Term Preterm AB Living  6 4 4  0 1 4  SAB TAB Ectopic Multiple Live Births  1 0 0 0 4    # Outcome Date GA Lbr Len/2nd Weight Sex Delivery Anes PTL Lv  6 Current           5 Term 2016     CS-LTranv   LIV  4 Term 04/03/13 647w5d  6 lb 2.4 oz (2.79 kg) M CS-Vac Spinal  LIV  3 SAB 2014          2 Term 02/02/11 1882w2d  7 lb 3.3 oz (3.27 kg) F CS-Vac Spinal  LIV     Birth Comments: scheduled repeat  1 Term 2010 4234w0d  9 lb 2 oz (4.139 kg) M CS-LTranv   LIV     Birth Comments: transverse lie     Past Medical History:  Diagnosis Date  . Chlamydia   . Infection    UTI  . Trichomonosis    hx  . Urinary tract infection    Past Surgical History:  Procedure Laterality Date  . CESAREAN SECTION  2010  . CESAREAN SECTION  02/02/2011   Procedure: CESAREAN SECTION;  Surgeon: Kathreen CosierBernard A Marshall, MD;  Location: [facility];  Service: Gynecology;  Laterality: N/A;  Repeat  . CESAREAN SECTION N/A 04/03/2013   Procedure: REPEAT CESAREAN SECTION;  Surgeon: Kathreen CosierBernard A Marshall, MD;  Location: [facility];  Service: Obstetrics;  Laterality: N/A;  X2   Family History  Problem Relation Age of Onset  . Diabetes Paternal Grandmother   . Hypertension Paternal Grandmother   . Cancer Paternal Grandmother     breast  . Cancer Paternal Grandfather     brain  . Hypertension Father   . Diabetes Father   . Heart disease Father   . Hypertension Mother   . Diabetes Mother   . Hearing loss Neg Hx      Exam    Uterus:     Pelvic Exam:    Perineum: No Hemorrhoids   Vulva: normal   Vagina:  normal mucosa   pH:     Cervix:  no lesions   Adnexa: not evaluated   Bony Pelvis: average  System: Breast:  normal appearance, no masses or tenderness   Skin: normal coloration and turgor, no rashes    Neurologic: oriented, normal mood   Extremities: normal strength, tone, and muscle mass   HEENT PERRLA, neck supple with midline trachea and palpable thyroid   Mouth/Teeth dental hygiene good   Neck supple and no masses   Cardiovascular: regular rate and rhythm, no murmurs or gallops   Respiratory:  appears well, vitals normal, no respiratory distress, acyanotic, normal RR, neck free of mass or lymphadenopathy, chest clear, no wheezing, crepitations, rhonchi, normal symmetric air entry   Abdomen:     Urinary: diastasis recti noted      Assessment:    Pregnancy: [id number] Patient Active Problem List   Diagnosis Date Noted  . Supervision of normal pregnancy, antepartum 06/17/2016  . Uterine scar from previous cesarean delivery affecting pregnancy  04/11/2016  . Bleeding in early pregnancy 04/11/2016  . S/P cesarean section 04/03/2013        Plan:     Initial labs drawn. Prenatal vitamins. Problem list reviewed and updated. Genetic Screening discussed Quad Screen: ordered.  Ultrasound discussed; fetal survey: ordered.  Follow up in 4 weeks. 50% of 30 min visit spent on counseling and coordination of care.  TSH added   Scheryl Darter 06/17/2016

## 2016-06-18 LAB — PRENATAL PROFILE I(LABCORP)
Antibody Screen: NEGATIVE
BASOS ABS: 0 10*3/uL (ref 0.0–0.2)
Basos: 0 %
EOS (ABSOLUTE): 0.1 10*3/uL (ref 0.0–0.4)
Eos: 2 %
HEMOGLOBIN: 10.3 g/dL — AB (ref 11.1–15.9)
Hematocrit: 32.3 % — ABNORMAL LOW (ref 34.0–46.6)
Hepatitis B Surface Ag: NEGATIVE
IMMATURE GRANULOCYTES: 0 %
Immature Grans (Abs): 0 10*3/uL (ref 0.0–0.1)
LYMPHS ABS: 1.6 10*3/uL (ref 0.7–3.1)
Lymphs: 23 %
MCH: 26.9 pg (ref 26.6–33.0)
MCHC: 31.9 g/dL (ref 31.5–35.7)
MCV: 84 fL (ref 79–97)
MONOS ABS: 0.7 10*3/uL (ref 0.1–0.9)
Monocytes: 9 %
NEUTROS ABS: 4.7 10*3/uL (ref 1.4–7.0)
NEUTROS PCT: 66 %
PLATELETS: 378 10*3/uL (ref 150–379)
RBC: 3.83 x10E6/uL (ref 3.77–5.28)
RDW: 16.9 % — ABNORMAL HIGH (ref 12.3–15.4)
RH TYPE: POSITIVE
RPR Ser Ql: NONREACTIVE
Rubella Antibodies, IGG: 4.28 index (ref >0.99)
WBC: 7.1 10*3/uL (ref 3.4–10.8)

## 2016-06-18 LAB — HEMOGLOBIN A1C
ESTIMATED AVERAGE GLUCOSE: 114 mg/dL
Hgb A1c MFr Bld: 5.6 % (ref 4.8–5.6)

## 2016-06-18 LAB — TSH: TSH: 0.951 u[IU]/mL (ref 0.450–4.500)

## 2016-06-21 LAB — CYTOLOGY - PAP
Chlamydia: NEGATIVE
Diagnosis: NEGATIVE
NEISSERIA GONORRHEA: NEGATIVE

## 2016-06-21 LAB — CULTURE, OB URINE

## 2016-06-21 LAB — URINE CULTURE, OB REFLEX

## 2016-06-23 ENCOUNTER — Telehealth: Payer: Self-pay | Admitting: *Deleted

## 2016-06-23 LAB — AFP, QUAD SCREEN
DIA MOM VALUE: 0.62
DIA VALUE (EIA): 123.42 pg/mL
DSR (By Age)    1 IN: 1078
DSR (SECOND TRIMESTER) 1 IN: 10000
GESTATIONAL AGE AFP: 19 wk
MATERNAL AGE AT EDD: 23.8 a
MSAFP Mom: 1.13
MSAFP: 66.1 ng/mL
MSHCG Mom: 0.28
MSHCG: 7683 m[IU]/mL
OSB RISK: 10000
T18 (By Age): 1:4199 {titer}
Test Results:: NEGATIVE
UE3 MOM: 0.93
UE3 VALUE: 1.55 ng/mL
WEIGHT: 133 [lb_av]

## 2016-06-23 NOTE — Telephone Encounter (Signed)
Patient's mobile number was busy. Attempted to call emergency contact, no answer and no vm set up. Will try later.

## 2016-06-23 NOTE — Telephone Encounter (Signed)
-----   Message from Adam PhenixJames G Arnold, MD sent at 06/23/2016  8:49 AM EDT ----- UTI Rx macrobid 100 mg BID 7 days

## 2016-06-24 ENCOUNTER — Other Ambulatory Visit: Payer: Self-pay | Admitting: Obstetrics & Gynecology

## 2016-06-24 MED ORDER — NITROFURANTOIN MONOHYD MACRO 100 MG PO CAPS: 100.0000 mg | ORAL_CAPSULE | Freq: Two times a day (BID) | ORAL | 0 refills | Status: AC

## 2016-06-24 NOTE — Telephone Encounter (Signed)
Call patient on voicemail left a message for her to call us back regarding a rx that needed to be called into the pharmacy.

## 2016-06-25 NOTE — Telephone Encounter (Signed)
Called pt @ [phone] and informed her of +UTI requiring antibiotic treatment. The Rx has been sent to her pharmacy. Dosage instructions discussed.  Pt voiced understanding.

## 2016-06-29 ENCOUNTER — Ambulatory Visit (HOSPITAL_COMMUNITY): Payer: Self-pay

## 2016-06-30 ENCOUNTER — Ambulatory Visit (HOSPITAL_COMMUNITY)
Admission: RE | Admit: 2016-06-30 | Discharge: 2016-06-30 | Disposition: A | Discharge status: Home / Self Care | Payer: Medicaid Other | Source: Ambulatory Visit | Attending: Obstetrics & Gynecology | Admitting: Obstetrics & Gynecology

## 2016-06-30 ENCOUNTER — Ambulatory Visit (HOSPITAL_COMMUNITY): Payer: Self-pay

## 2016-06-30 DIAGNOSIS — Z363 Encounter for antenatal screening for malformations: Secondary | ICD-10-CM | POA: Diagnosis not present

## 2016-06-30 DIAGNOSIS — O34211 Maternal care for low transverse scar from previous cesarean delivery: Secondary | ICD-10-CM | POA: Diagnosis not present

## 2016-06-30 DIAGNOSIS — O0990 Supervision of high risk pregnancy, unspecified, unspecified trimester: Secondary | ICD-10-CM

## 2016-06-30 DIAGNOSIS — Z3A20 20 weeks gestation of pregnancy: Secondary | ICD-10-CM | POA: Insufficient documentation

## 2016-07-15 ENCOUNTER — Ambulatory Visit (INDEPENDENT_AMBULATORY_CARE_PROVIDER_SITE_OTHER): Payer: Medicaid Other | Admitting: Advanced Practice Midwife

## 2016-07-15 VITALS — BP 107/53 | HR 89 | Wt 134.3 lb

## 2016-07-15 DIAGNOSIS — Z3480 Encounter for supervision of other normal pregnancy, unspecified trimester: Secondary | ICD-10-CM

## 2016-07-15 DIAGNOSIS — O2342 Unspecified infection of urinary tract in pregnancy, second trimester: Secondary | ICD-10-CM

## 2016-07-15 NOTE — Patient Instructions (Signed)
Contraception Choices Contraception (birth control) is the use of any methods or devices to prevent pregnancy. Below are some methods to help avoid pregnancy. Hormonal methods  Contraceptive implant. This is a thin, plastic tube containing progesterone hormone. It does not contain estrogen hormone. Your health care provider inserts the tube in the inner part of the upper arm. The tube can remain in place for up to 3 years. After 3 years, the implant must be removed. The implant prevents the ovaries from releasing an egg (ovulation), thickens the cervical mucus to prevent sperm from entering the uterus, and thins the lining of the inside of the uterus.  Progesterone-only injections. These injections are given every 3 months by your health care provider to prevent pregnancy. This synthetic progesterone hormone stops the ovaries from releasing eggs. It also thickens cervical mucus and changes the uterine lining. This makes it harder for sperm to survive in the uterus.  Birth control pills. These pills contain estrogen and progesterone hormone. They work by preventing the ovaries from releasing eggs (ovulation). They also cause the cervical mucus to thicken, preventing the sperm from entering the uterus. Birth control pills are prescribed by a health care provider.Birth control pills can also be used to treat heavy periods.  Minipill. This type of birth control pill contains only the progesterone hormone. They are taken every day of each month and must be prescribed by your health care provider.  Birth control patch. The patch contains hormones similar to those in birth control pills. It must be changed once a week and is prescribed by a health care provider.  Vaginal ring. The ring contains hormones similar to those in birth control pills. It is left in the vagina for 3 weeks, removed for 1 week, and then a new one is put back in place. The patient must be comfortable inserting and removing the ring from  the vagina.A health care provider's prescription is necessary.  Emergency contraception. Emergency contraceptives prevent pregnancy after unprotected sexual intercourse. This pill can be taken right after sex or up to 5 days after unprotected sex. It is most effective the sooner you take the pills after having sexual intercourse. Most emergency contraceptive pills are available without a prescription. Check with your pharmacist. Do not use emergency contraception as your only form of birth control. Barrier methods  Female condom. This is a thin sheath (latex or rubber) that is worn over the penis during sexual intercourse. It can be used with spermicide to increase effectiveness.  Female condom. This is a soft, loose-fitting sheath that is put into the vagina before sexual intercourse.  Diaphragm. This is a soft, latex, dome-shaped barrier that must be fitted by a health care provider. It is inserted into the vagina, along with a spermicidal jelly. It is inserted before intercourse. The diaphragm should be left in the vagina for 6 to 8 hours after intercourse.  Cervical cap. This is a round, soft, latex or plastic cup that fits over the cervix and must be fitted by a health care provider. The cap can be left in place for up to 48 hours after intercourse.  Sponge. This is a soft, circular piece of polyurethane foam. The sponge has spermicide in it. It is inserted into the vagina after wetting it and before sexual intercourse.  Spermicides. These are chemicals that kill or block sperm from entering the cervix and uterus. They come in the form of creams, jellies, suppositories, foam, or tablets. They do not require a prescription. They   are inserted into the vagina with an applicator before having sexual intercourse. The process must be repeated every time you have sexual intercourse. Intrauterine contraception  Intrauterine device (IUD). This is a T-shaped device that is put in a woman's uterus during  a menstrual period to prevent pregnancy. There are 2 types: ? Copper IUD. This type of IUD is wrapped in copper wire and is placed inside the uterus. Copper makes the uterus and fallopian tubes produce a fluid that kills sperm. It can stay in place for 10 years. ? Hormone IUD. This type of IUD contains the hormone progestin (synthetic progesterone). The hormone thickens the cervical mucus and prevents sperm from entering the uterus, and it also thins the uterine lining to prevent implantation of a fertilized egg. The hormone can weaken or kill the sperm that get into the uterus. It can stay in place for 3-5 years, depending on which type of IUD is used. Permanent methods of contraception  Female tubal ligation. This is when the woman's fallopian tubes are surgically sealed, tied, or blocked to prevent the egg from traveling to the uterus.  Hysteroscopic sterilization. This involves placing a small coil or insert into each fallopian tube. Your doctor uses a technique called hysteroscopy to do the procedure. The device causes scar tissue to form. This results in permanent blockage of the fallopian tubes, so the sperm cannot fertilize the egg. It takes about 3 months after the procedure for the tubes to become blocked. You must use another form of birth control for these 3 months.  Female sterilization. This is when the female has the tubes that carry sperm tied off (vasectomy).This blocks sperm from entering the vagina during sexual intercourse. After the procedure, the man can still ejaculate fluid (semen). Natural planning methods  Natural family planning. This is not having sexual intercourse or using a barrier method (condom, diaphragm, cervical cap) on days the woman could become pregnant.  Calendar method. This is keeping track of the length of each menstrual cycle and identifying when you are fertile.  Ovulation method. This is avoiding sexual intercourse during ovulation.  Symptothermal method.  This is avoiding sexual intercourse during ovulation, using a thermometer and ovulation symptoms.  Post-ovulation method. This is timing sexual intercourse after you have ovulated. Regardless of which type or method of contraception you choose, it is important that you use condoms to protect against the transmission of sexually transmitted infections (STIs). Talk with your health care provider about which form of contraception is most appropriate for you. This information is not intended to replace advice given to you by your health care provider. Make sure you discuss any questions you have with your health care provider. Document Released: 03/29/2005 Document Revised: 09/04/2015 Document Reviewed: 09/21/2012 Elsevier Interactive Patient Education  2017 Elsevier Inc.  

## 2016-07-15 NOTE — Progress Notes (Signed)
   PRENATAL VISIT NOTE  Subjective:  Rose [name] is a 26 y.o. Z6X0960 at [redacted]w[redacted]d being seen today for ongoing prenatal care.  She is currently monitored for the following issues for this low-risk pregnancy and has Uterine scar from previous cesarean delivery affecting pregnancy; Bleeding in early pregnancy; and Supervision of normal pregnancy, antepartum on her problem list.  Patient reports no complaints.  Contractions: Irritability. One UC.  Vag. Bleeding: None.  Movement: Present. Denies leaking of fluid.   The following portions of the patient's history were reviewed and updated as appropriate: allergies, current medications, past family history, past medical history, past social history, past surgical history and problem list. Problem list updated.  Objective:   Vitals:   07/15/16 0805  BP: (!) 107/53  Pulse: 89  Weight: 134 lb 4.8 oz (60.9 kg)    Fetal Status: Fetal Heart Rate (bpm): 143 Fundal Height: 23 cm Movement: Present     General:  Alert, oriented and cooperative. Patient is in no acute distress.  Skin: Skin is warm and dry. No rash noted.   Cardiovascular: Normal heart rate noted  Respiratory: Normal respiratory effort, no problems with respiration noted  Abdomen: Soft, gravid, appropriate for gestational age. Pain/Pressure: Present . Discussed--Nml for 2nd trimester. Large diastasis rectus, ? Hernia w/out incarceration   Pelvic:  Cervical exam deferred        Extremities: Normal range of motion.  Edema: None  Mental Status: Normal mood and affect. Normal behavior. Normal judgment and thought content.   Assessment and Plan:  Pregnancy: [name] at [redacted]w[redacted]d  1. Supervision of other normal pregnancy, antepartum   2. Urinary tract infection in mother during second trimester of pregnancy  - Culture, OB Urine  Preterm labor symptoms and general obstetric precautions including but not limited to vaginal bleeding, contractions, leaking of fluid and fetal movement were  reviewed in detail with the patient. Please refer to After Visit Summary for other counseling recommendations.  Discussed LARC and sterilization. Undecided.  Return in about 4 weeks (around 08/12/2016) for ROB/GTT.   Dorathy Kinsman, CNM

## 2016-07-15 NOTE — Progress Notes (Signed)
Educated pt on Breastfeeding First Six Months

## 2016-07-22 ENCOUNTER — Other Ambulatory Visit: Payer: Self-pay | Admitting: Advanced Practice Midwife

## 2016-07-22 ENCOUNTER — Encounter: Payer: Self-pay | Admitting: Advanced Practice Midwife

## 2016-07-22 DIAGNOSIS — B951 Streptococcus, group B, as the cause of diseases classified elsewhere: Secondary | ICD-10-CM | POA: Insufficient documentation

## 2016-07-22 DIAGNOSIS — O2340 Unspecified infection of urinary tract in pregnancy, unspecified trimester: Secondary | ICD-10-CM

## 2016-07-22 DIAGNOSIS — R8271 Bacteriuria: Secondary | ICD-10-CM

## 2016-07-22 LAB — URINE CULTURE, OB REFLEX

## 2016-07-22 LAB — CULTURE, OB URINE

## 2016-07-22 MED ORDER — AMOXICILLIN 875 MG PO TABS: 875.0000 mg | ORAL_TABLET | Freq: Two times a day (BID) | ORAL | 0 refills | Status: DC

## 2016-07-22 NOTE — Progress Notes (Signed)
GBS bacteriuria. Rx Amox now, PCN in labor.

## 2016-07-27 ENCOUNTER — Encounter (INDEPENDENT_AMBULATORY_CARE_PROVIDER_SITE_OTHER): Payer: Self-pay

## 2016-08-10 ENCOUNTER — Ambulatory Visit (INDEPENDENT_AMBULATORY_CARE_PROVIDER_SITE_OTHER): Payer: Medicaid Other | Admitting: Advanced Practice Midwife

## 2016-08-10 VITALS — BP 111/49 | HR 96 | Wt 134.9 lb

## 2016-08-10 DIAGNOSIS — Z3480 Encounter for supervision of other normal pregnancy, unspecified trimester: Secondary | ICD-10-CM

## 2016-08-10 DIAGNOSIS — Z3482 Encounter for supervision of other normal pregnancy, second trimester: Secondary | ICD-10-CM

## 2016-08-10 DIAGNOSIS — O34219 Maternal care for unspecified type scar from previous cesarean delivery: Secondary | ICD-10-CM

## 2016-08-10 DIAGNOSIS — O3429 Maternal care due to uterine scar from other previous surgery: Secondary | ICD-10-CM

## 2016-08-10 DIAGNOSIS — J301 Allergic rhinitis due to pollen: Secondary | ICD-10-CM

## 2016-08-10 MED ORDER — FLUTICASONE PROPIONATE 50 MCG/ACT NA SUSP: 2.0000 | Freq: Every day | NASAL | 2 refills | Status: DC

## 2016-08-10 NOTE — Progress Notes (Signed)
   PRENATAL VISIT NOTE  Subjective:  Rose [name] is a 26 y.o. Z6X0960 at [redacted]w[redacted]d being seen today for ongoing prenatal care.  She is currently monitored for the following issues for this low-risk pregnancy and has Uterine scar from previous cesarean delivery affecting pregnancy; Bleeding in early pregnancy; Supervision of normal pregnancy, antepartum; and Group B streptococcus urinary tract infection affecting pregnancy on her problem list.  Not fasting for GTT today. Hasn't started ABX for GSB UTI.   Patient reports occasional contractions and seasonal allergies that have not been adequately Tx'd w/ Claritin, Zyrtec and intermittent sharp upper abd pain. Denies fever, chill, N/V/D/C. Contractions: Irritability. Sporadic upon further questioning. Vag. Bleeding: Other.  Movement: Present. Denies leaking of fluid.   The following portions of the patient's history were reviewed and updated as appropriate: allergies, current medications, past family history, past medical history, past social history, past surgical history and problem list. Problem list updated.  Objective:   Vitals:   08/10/16 0826  BP: (!) 111/49  Pulse: 96  Weight: 134 lb 14.4 oz (61.2 kg)    Fetal Status: Fetal Heart Rate (bpm): 134 Fundal Height: 27 cm Movement: Present     General:  Alert, oriented and cooperative. Patient is in no acute distress.  Skin: Skin is warm and dry. No rash noted.   Cardiovascular: Normal heart rate noted  Respiratory: Normal respiratory effort, no problems with respiration noted  Abdomen: Soft, gravid, appropriate for gestational age. Pain/Pressure: Absent . Upper abd NT.     Pelvic:  Cervical exam declined        Extremities: Normal range of motion.  Edema: None  Mental Status: Normal mood and affect. Normal behavior. Normal judgment and thought content.   Assessment and Plan:  Pregnancy: [name] at [redacted]w[redacted]d  1. Supervision of other normal pregnancy, antepartum - GTT at [place].   2.  Uterine scar from previous cesarean delivery affecting pregnancy - Plan repeat at 39 weeks  3. Seasonal allergic rhinitis due to pollen  - fluticasone (FLONASE) 50 MCG/ACT nasal spray; Place 2 sprays into both nostrils daily.  Dispense: 9.9 g; Refill: 2  4. Abd pain pregnancy Possibly GERD VS MS pain. Pepcid PRN  Take ABX as directed. Pyelo precautions.   Preterm labor symptoms and general obstetric precautions including but not limited to vaginal bleeding, contractions, leaking of fluid and fetal movement were reviewed in detail with the patient. Please refer to After Visit Summary for other counseling recommendations.  Return in about 2 weeks (around 08/24/2016) for ROB/GTT.   Dorathy Kinsman, CNM

## 2016-08-10 NOTE — Patient Instructions (Addendum)
Safe Medications in Pregnancy   Acne: Benzoyl Peroxide Salicylic Acid  Backache/Headache: Tylenol: 2 regular strength every 4 hours OR              2 Extra strength every 6 hours  Colds/Coughs/Allergies: Benadryl (alcohol free) 25 mg every 6 hours as needed Breath right strips Claritin Cepacol throat lozenges Chloraseptic throat spray Cold-Eeze- up to three times per day Cough drops, alcohol free Flonase (by prescription only) Guaifenesin Mucinex Robitussin DM (plain only, alcohol free) Saline nasal spray/drops Sudafed (pseudoephedrine) & Actifed ** use only after [redacted] weeks gestation and if you do not have high blood pressure Tylenol Vicks Vaporub Zinc lozenges Zyrtec   Constipation: Colace Ducolax suppositories Fleet enema Glycerin suppositories Metamucil Milk of magnesia Miralax Senokot Smooth move tea  Diarrhea: Kaopectate Imodium A-D  *NO pepto Bismol  Hemorrhoids: Anusol Anusol HC Preparation H Tucks  Indigestion: Tums Maalox Mylanta Zantac  Pepcid  Insomnia: Benadryl (alcohol free)  every 6 hours as needed Tylenol PM Unisom, no Gelcaps  Leg Cramps: Tums MagGel  Nausea/Vomiting:  Bonine Dramamine Emetrol Ginger extract Sea bands Meclizine  Nausea medication to take during pregnancy:  Unisom (doxylamine succinate 25 mg tablets) Take one tablet daily at bedtime. If symptoms are not adequately controlled, the dose can be increased to a maximum recommended dose of two tablets daily (1/2 tablet in the morning, 1/2 tablet mid-afternoon and one at bedtime). Vitamin B6  tablets. Take one tablet twice a day (up to 200 mg per day).  Skin Rashes: Aveeno products Benadryl cream or  every 6 hours as needed Calamine Lotion 1% cortisone cream  Yeast infection: Gyne-lotrimin 7 Monistat 7   **If taking multiple medications, please check labels to avoid duplicating the same active ingredients **take medication as directed on  the label ** Do not exceed 4000 mg of tylenol in 24 hours **Do not take medications that contain aspirin or ibuprofen      Glucose Tolerance Test The glucose tolerance test (GTT) is one of several tests used to diagnose diabetes mellitus. The GTT is a blood test, and it may include a urine test as well. The GTT checks to see how your body processes sugar (glucose). For this test, you will consume a drink containing a high level of glucose. Your blood glucose levels will be checked before you consume the drink and then again 1, 2, 3, and possibly 4 hours after you consume it. Your health care provider may recommend that you have the GTT if you:  Have a family history of diabetes.  Are very overweight (obese).  Have experienced infections that keep coming back.  Have had numerous cuts or wounds that did not heal quickly, especially on your legs and feet.  Are a woman and have a history of giving birth to very large babies or a history of repeated fetal loss (stillbirth).  Have had glucose in your urine or high blood sugar:  During pregnancy.  After a heart attack, surgery, or prolonged periods of high stress. The GTT lasts 3-4 hours. Other than the glucose solution, you will not be allowed to eat or drink anything during the test. You must remain at the testing location to make sure that your blood and urine samples are taken on time. How do I prepare for this test? Eat normally for 3 days prior to the GTT test, including having plenty of carbohydrate-rich foods. Do not eat or drink anything except water during the final 12 hours before the test. You  should not smoke or exercise during the test. In addition, your health care provider may ask you to stop taking certain medicines before the test. What do the results mean? It is your responsibility to obtain your test results. Ask the lab or department performing the test when and how you will get your results. Contact your health care  provider to discuss any questions you have about your results. Range of Normal Values  Ranges for normal values may vary among different labs and hospitals. You should always check with your health care provider after having lab work or other tests done to discuss whether your values are considered within normal limits. Normal levels of blood glucose are as follows:  Fasting: less than 110 mg/dL or less than 6.1 mmol/L (SI units).  1 hour after consuming the glucose drink: less than 200 mg/dL or less than 16.1 mmol/L.  2 hours after consuming the glucose drink: less than 140 mg/dL or less than 7.8 mmol/L.  3 hours after consuming the glucose drink: 70-115 mg/dL or less than 6.4 mmol/L.  4 hours after consuming the glucose drink: 70-115 mg/dL or less than 6.4 mmol/L. The normal result for the urine test is negative, meaning that glucose is absent from your urine. Some substances can interfere with GTT results. These may include:  Blood pressure and heart failure medicines, including beta blockers, furosemide, and thiazides.  Anti-inflammatory medicines, including aspirin.  Nicotine.  Some psychiatric medicines.  Oral contraceptives.  Diuretics or corticosteroids. Meaning of Results Outside Normal Value Ranges  GTT test results that are above normal values may indicate health problems, such as:  Diabetes mellitus.  Acute stress response.  Cushing syndrome.  Tumors such as pheochromocytoma or glucagonoma.  Chronic renal failure.  Pancreatitis.  Hyperthyroidism.  Current infection. Discuss your test results with your health care provider. He or she will use the results to make a diagnosis and determine a treatment plan that is right for you. Talk with your health care provider to discuss your results, treatment options, and if necessary, the need for more tests. Talk with your health care provider if you have any questions about your results. This information is not  intended to replace advice given to you by your health care provider. Make sure you discuss any questions you have with your health care provider. Document Released: 04/21/2004 Document Revised: 12/02/2015 Document Reviewed: 08/03/2013 Elsevier Interactive Patient Education  2017 [employer].

## 2016-08-18 ENCOUNTER — Encounter (HOSPITAL_COMMUNITY): Payer: Self-pay

## 2016-08-18 ENCOUNTER — Inpatient Hospital Stay (HOSPITAL_COMMUNITY)
Admission: AD | Admit: 2016-08-18 | Discharge: 2016-08-18 | Disposition: A | Discharge status: Home / Self Care | Payer: Medicaid Other | Source: Ambulatory Visit | Attending: Family Medicine | Admitting: Family Medicine

## 2016-08-18 DIAGNOSIS — O2342 Unspecified infection of urinary tract in pregnancy, second trimester: Secondary | ICD-10-CM | POA: Diagnosis present

## 2016-08-18 DIAGNOSIS — O26892 Other specified pregnancy related conditions, second trimester: Secondary | ICD-10-CM | POA: Diagnosis present

## 2016-08-18 DIAGNOSIS — Z3A27 27 weeks gestation of pregnancy: Secondary | ICD-10-CM | POA: Diagnosis not present

## 2016-08-18 DIAGNOSIS — R109 Unspecified abdominal pain: Secondary | ICD-10-CM

## 2016-08-18 HISTORY — DX: Unspecified infection of urinary tract in pregnancy, second trimester: O23.42

## 2016-08-18 LAB — URINALYSIS, ROUTINE W REFLEX MICROSCOPIC
BILIRUBIN URINE: NEGATIVE
Glucose, UA: NEGATIVE mg/dL
KETONES UR: 80 mg/dL — AB
Nitrite: POSITIVE — AB
PROTEIN: 30 mg/dL — AB
Specific Gravity, Urine: 1.017 (ref 1.005–1.030)
pH: 5 (ref 5.0–8.0)

## 2016-08-18 MED ORDER — ACETAMINOPHEN 500 MG PO TABS
1000.0000 mg | ORAL_TABLET | Freq: Once | ORAL | Status: AC
  Administered 2016-08-18: 1000 mg via ORAL
  Filled 2016-08-18: qty 2

## 2016-08-18 MED ORDER — NITROFURANTOIN MONOHYD MACRO 100 MG PO CAPS: 100.0000 mg | ORAL_CAPSULE | Freq: Two times a day (BID) | ORAL | 0 refills | Status: AC

## 2016-08-18 NOTE — MAU Provider Note (Signed)
History     CSN: [id number]  Arrival date and time: 08/18/16 1011   First Provider Initiated Contact with Patient 08/18/16 1046      Chief Complaint  Patient presents with  . Contractions  . Nausea   HPI  [name] is 26 yo G6P4014 at 27.[redacted] wks gestation by ultrasound presenting with irregular contractions, back pain and vomiting 2 days ago. She has not vomited since Monday. She has not taken anything for the back pain. She denies VB or LOF. She reports (+) FM.  Past Medical History:  Diagnosis Date  . Chlamydia   . Infection    UTI  . Trichomonosis    hx  . Urinary tract infection     Past Surgical History:  Procedure Laterality Date  . CESAREAN SECTION  2010  . CESAREAN SECTION  02/02/2011   Procedure: CESAREAN SECTION;  Surgeon: Kathreen Cosier, MD;  Location: [facility];  Service: Gynecology;  Laterality: N/A;  Repeat  . CESAREAN SECTION N/A 04/03/2013   Procedure: REPEAT CESAREAN SECTION;  Surgeon: Kathreen Cosier, MD;  Location: [facility];  Service: Obstetrics;  Laterality: N/A;  X2    Family History  Problem Relation Age of Onset  . Diabetes Paternal Grandmother   . Hypertension Paternal Grandmother   . Cancer Paternal Grandmother     breast  . Cancer Paternal Grandfather     brain  . Hypertension Father   . Diabetes Father   . Heart disease Father   . Hypertension Mother   . Diabetes Mother   . Hearing loss Neg Hx     Social History  Substance Use Topics  . Smoking status: Never Smoker  . Smokeless tobacco: Never Used  . Alcohol use No    Allergies: No Known Allergies  Prescriptions Prior to Admission  Medication Sig Dispense Refill Last Dose  . Prenatal Multivit-Min-Fe-FA (PRENATAL VITAMINS) 0.8 MG tablet Take 1 tablet by mouth daily. 30 tablet 12 08/17/2016 at Unknown time  . amoxicillin (AMOXIL) 875 MG tablet Take 1 tablet (875 mg total) by mouth 2 (two) times daily. (Patient not taking: Reported on 08/18/2016) 14 tablet 0 Not Taking at  Unknown time  . fluticasone (FLONASE) 50 MCG/ACT nasal spray Place 2 sprays into both nostrils daily. (Patient not taking: Reported on 08/18/2016) 9.9 g 2 Not Taking at Unknown time    Review of Systems  Constitutional: Negative.   HENT: Negative.   Eyes: Negative.   Respiratory: Negative.   Cardiovascular: Negative.   Gastrointestinal: Positive for abdominal pain (contractions) and nausea. Negative for vomiting (2 days ago).  Endocrine: Negative.   Genitourinary: Positive for pelvic pain.  Musculoskeletal: Positive for back pain.  Allergic/Immunologic: Negative.   Neurological: Negative.   Hematological: Negative.   Psychiatric/Behavioral: Negative.    Physical Exam   Blood pressure (!) 112/53, pulse (!) 105, resp. rate 16, height 4\' 11"  (1.499 m), weight 60.8 kg (134 lb), last menstrual period 02/05/2016, SpO2 100 %.  Physical Exam  Constitutional: She is oriented to person, place, and time. She appears well-developed and well-nourished.  HENT:  Head: Normocephalic.  Eyes: Pupils are equal, round, and reactive to light.  Neck: Normal range of motion.  Cardiovascular: Normal rate, regular rhythm, normal heart sounds and intact distal pulses.   Respiratory: Effort normal and breath sounds normal.  GI: Soft. Bowel sounds are normal.  Genitourinary:  Genitourinary Comments: Gravid, S=D, cx: closed/long/firm/posterior  Musculoskeletal: Normal range of motion.  Neurological: She is alert and  oriented to person, place, and time. She has normal reflexes.  Skin: Skin is warm and dry.  Psychiatric: Her behavior is normal. Judgment and thought content normal.  Asleep and difficult to arouse x 1 minute while provider in room   CEFM  FHR: 140 bpm / moderate variability / accels present / decels absent TOCO: no UC's noted  Results for orders placed or performed during the hospital encounter of 08/18/16 (from the past 24 hour(s))  Urinalysis, Routine w reflex microscopic     Status:  Abnormal   Collection Time: 08/18/16 12:12 PM  Result Value Ref Range   Color, Urine AMBER (A) YELLOW   APPearance TURBID (A) CLEAR   Specific Gravity, Urine 1.017 1.005 - 1.030   pH 5.0 5.0 - 8.0   Glucose, UA NEGATIVE NEGATIVE mg/dL   Hgb urine dipstick SMALL (A) NEGATIVE   Bilirubin Urine NEGATIVE NEGATIVE   Ketones, ur 80 (A) NEGATIVE mg/dL   Protein, ur 30 (A) NEGATIVE mg/dL   Nitrite POSITIVE (A) NEGATIVE   Leukocytes, UA LARGE (A) NEGATIVE   RBC / HPF 6-30 0 - 5 RBC/hpf   WBC, UA TOO NUMEROUS TO COUNT 0 - 5 WBC/hpf   Bacteria, UA MANY (A) NONE SEEN   Squamous Epithelial / LPF 6-30 (A) NONE SEEN   WBC Clumps PRESENT    Mucous PRESENT    MAU Course  Procedures  MDM NST CCUA - delay in results d/t length of time before pt could leave sample and mislabelled specimen Pelvic/VE  Assessment and Plan  26 yo [id number] at 27.[redacted] wks gestation Abdominal Cramping in pregnancy UTI affecting pregnancy   Left AMA without results d/t childcare issues / TC to discuss results and inform pt of Rx being sent to pharmacy - LVM to call MAU to discuss @ 1347. Rx Macrobid 100 mg BID x 7 days sent to pharmacy  Raelyn Moraolitta Zivah Mayr , MSN, CNM 08/18/2016, 10:56 AM

## 2016-08-18 NOTE — MAU Note (Signed)
Pt has been feeling like she's been having irregular ctx. Two days ago was feeling nauseous and hot and had to pull over driving and vomited. Has not had vomiting since.

## 2016-08-18 NOTE — Discharge Instructions (Signed)
Abdominal Pain During Pregnancy Abdominal pain is common in pregnancy. Most of the time, it does not cause harm. There are many causes of abdominal pain. Some causes are more serious than others and sometimes the cause is not known. Abdominal pain can be a sign that something is very wrong with the pregnancy or the pain may have nothing to do with the pregnancy. Always tell your health care provider if you have any abdominal pain. Follow these instructions at home:  Do not have sex or put anything in your vagina until your symptoms go away completely.  Watch your abdominal pain for any changes.  Get plenty of rest until your pain improves.  Drink enough fluid to keep your urine clear or pale yellow.  Take over-the-counter or prescription medicines only as told by your health care provider.  Keep all follow-up visits as told by your health care provider. This is important. Contact a health care provider if:  You have a fever.  Your pain gets worse or you have cramping.  Your pain continues after resting. Get help right away if:  You are bleeding, leaking fluid, or passing tissue from the vagina.  You have vomiting or diarrhea that does not go away.  You have painful or bloody urination.  You notice a decrease in your baby's movements.  You feel very weak or faint.  You have shortness of breath.  You develop a severe headache with abdominal pain.  You have abnormal vaginal discharge with abdominal pain. This information is not intended to replace advice given to you by your health care provider. Make sure you discuss any questions you have with your health care provider. Document Released: 03/29/2005 Document Revised: 01/08/2016 Document Reviewed: 10/26/2012 Elsevier Interactive Patient Education  2017 [employer]  Pregnancy and Urinary Tract Infection What is a urinary tract infection? A urinary tract infection (UTI) is an infection of any part of the urinary tract.  This includes the kidneys, the tubes that connect your kidneys to your bladder (ureters), the bladder, and the tube that carries urine out of your body (urethra). These organs make, store, and get rid of urine in the body. A UTI can be a bladder infection (cystitis) or a kidney infection (pyelonephritis). This infection may be caused by fungi, viruses, and bacteria. Bacteria are the most common cause of UTIs. You are more likely to develop a UTI during pregnancy because:  The physical and hormonal changes your body goes through can make it easier for bacteria to get into your urinary tract.  Your growing baby puts pressure on your uterus and can affect urine flow. Does a UTI place my baby at risk? An untreated UTI during pregnancy could lead to a kidney infection, which can cause health problems that could affect your baby. Possible complications of an untreated UTI include:  Having your baby before 37 weeks of pregnancy (premature).  Having a baby with a low birth weight.  Developing high blood pressure during pregnancy (preeclampsia). What are the symptoms of a UTI? Symptoms of a UTI include:  Fever.  Frequent urination or passing small amounts of urine frequently.  Needing to urinate urgently.  Pain or a burning sensation with urination.  Urine that smells bad or unusual.  Cloudy urine.  Pain in the lower abdomen or back.  Trouble urinating.  Blood in the urine.  Vomiting or being less hungry than normal.  Diarrhea or abdominal pain.  Vaginal discharge. What are the treatment options for a UTI during  pregnancy? Treatment for this condition may include:  Antibiotic medicines that are safe to take during pregnancy.  Other medicines to treat less common causes of UTI. How can I prevent a UTI?   To prevent a UTI:  Go to the bathroom as soon as you feel the need.  Always wipe from front to back.  Wash your genital area with soap and warm water daily.  Empty your  bladder before and after sex.  Wear cotton underwear.  Limit your intake of high sugar foods or drinks, such as regular soda, juice, and sweets.  Drink 6-8 glasses of water daily.  Do not wear tight-fitting pants.  Do not douche or use deodorant sprays.  Do not drink alcohol, caffeine, or carbonated drinks. These can irritate the bladder. Contact a health care provider if:  Your symptoms do not improve or get worse.  You have a fever after two days of treatment.  You have a rash.  You have abnormal vaginal discharge.  You have back or side pain.  You have chills.  You have nausea and vomiting. Get help right away if: Seek immediate medical care if you are pregnant and:  You feel contractions in your uterus.  You have lower belly pain.  You have a gush of fluid from your vagina.  You have blood in your urine.  You are vomiting and cannot keep down any medicines or water. This information is not intended to replace advice given to you by your health care provider. Make sure you discuss any questions you have with your health care provider. Document Released: 07/24/2010 Document Revised: 03/12/2016 Document Reviewed: 02/17/2015 Elsevier Interactive Patient Education  2017 [employer].

## 2016-08-18 NOTE — MAU Note (Signed)
Pt walked out, had said she needed to pick up her other children

## 2016-08-18 NOTE — MAU Note (Signed)
Pt not able to leave urine sample

## 2016-08-21 LAB — CULTURE, OB URINE: SPECIAL REQUESTS: NORMAL

## 2016-08-24 ENCOUNTER — Ambulatory Visit (INDEPENDENT_AMBULATORY_CARE_PROVIDER_SITE_OTHER): Payer: Medicaid Other | Admitting: Advanced Practice Midwife

## 2016-08-24 VITALS — BP 125/54 | HR 86 | Wt 134.8 lb

## 2016-08-24 DIAGNOSIS — J301 Allergic rhinitis due to pollen: Secondary | ICD-10-CM

## 2016-08-24 DIAGNOSIS — Z3483 Encounter for supervision of other normal pregnancy, third trimester: Secondary | ICD-10-CM

## 2016-08-24 DIAGNOSIS — O99013 Anemia complicating pregnancy, third trimester: Secondary | ICD-10-CM

## 2016-08-24 DIAGNOSIS — D649 Anemia, unspecified: Secondary | ICD-10-CM

## 2016-08-24 MED ORDER — FLUTICASONE PROPIONATE 50 MCG/ACT NA SUSP: 2.0000 | Freq: Every day | NASAL | 2 refills | Status: DC

## 2016-08-24 NOTE — Patient Instructions (Signed)
Third Trimester of Pregnancy The third trimester is from week 28 through week 40 (months 7 through 9). The third trimester is a time when the unborn baby (fetus) is growing rapidly. At the end of the ninth month, the fetus is about 20 inches in length and weighs 6-10 pounds. Body changes during your third trimester Your body will continue to go through many changes during pregnancy. The changes vary from woman to woman. During the third trimester:  Your weight will continue to increase. You can expect to gain 25-35 pounds (11-16 kg) by the end of the pregnancy.  You may begin to get stretch marks on your hips, abdomen, and breasts.  You may urinate more often because the fetus is moving lower into your pelvis and pressing on your bladder.  You may develop or continue to have heartburn. This is caused by increased hormones that slow down muscles in the digestive tract.  You may develop or continue to have constipation because increased hormones slow digestion and cause the muscles that push waste through your intestines to relax.  You may develop hemorrhoids. These are swollen veins (varicose veins) in the rectum that can itch or be painful.  You may develop swollen, bulging veins (varicose veins) in your legs.  You may have increased body aches in the pelvis, back, or thighs. This is due to weight gain and increased hormones that are relaxing your joints.  You may have changes in your hair. These can include thickening of your hair, rapid growth, and changes in texture. Some women also have hair loss during or after pregnancy, or hair that feels dry or thin. Your hair will most likely return to normal after your baby is born.  Your breasts will continue to grow and they will continue to become tender. A yellow fluid (colostrum) may leak from your breasts. This is the first milk you are producing for your baby.  Your belly button may stick out.  You may notice more swelling in your hands,  face, or ankles.  You may have increased tingling or numbness in your hands, arms, and legs. The skin on your belly may also feel numb.  You may feel short of breath because of your expanding uterus.  You may have more problems sleeping. This can be caused by the size of your belly, increased need to urinate, and an increase in your body's metabolism.  You may notice the fetus "dropping," or moving lower in your abdomen (lightening).  You may have increased vaginal discharge.  You may notice your joints feel loose and you may have pain around your pelvic bone.  What to expect at prenatal visits You will have prenatal exams every 2 weeks until week 36. Then you will have weekly prenatal exams. During a routine prenatal visit:  You will be weighed to make sure you and the baby are growing normally.  Your blood pressure will be taken.  Your abdomen will be measured to track your baby's growth.  The fetal heartbeat will be listened to.  Any test results from the previous visit will be discussed.  You may have a cervical check near your due date to see if your cervix has softened or thinned (effaced).  You will be tested for Group B streptococcus. This happens between 35 and 37 weeks.  Your health care provider may ask you:  What your birth plan is.  How you are feeling.  If you are feeling the baby move.  If you have had   any abnormal symptoms, such as leaking fluid, bleeding, severe headaches, or abdominal cramping.  If you are using any tobacco products, including cigarettes, chewing tobacco, and electronic cigarettes.  If you have any questions.  Other tests or screenings that may be performed during your third trimester include:  Blood tests that check for low iron levels (anemia).  Fetal testing to check the health, activity level, and growth of the fetus. Testing is done if you have certain medical conditions or if there are problems during the  pregnancy.  Nonstress test (NST). This test checks the health of your baby to make sure there are no signs of problems, such as the baby not getting enough oxygen. During this test, a belt is placed around your belly. The baby is made to move, and its heart rate is monitored during movement.  What is false labor? False labor is a condition in which you feel small, irregular tightenings of the muscles in the womb (contractions) that usually go away with rest, changing position, or drinking water. These are called Braxton Hicks contractions. Contractions may last for hours, days, or even weeks before true labor sets in. If contractions come at regular intervals, become more frequent, increase in intensity, or become painful, you should see your health care provider. What are the signs of labor?  Abdominal cramps.  Regular contractions that start at 10 minutes apart and become stronger and more frequent with time.  Contractions that start on the top of the uterus and spread down to the lower abdomen and back.  Increased pelvic pressure and dull back pain.  A watery or bloody mucus discharge that comes from the vagina.  Leaking of amniotic fluid. This is also known as your "water breaking." It could be a slow trickle or a gush. Let your health care provider know if it has a color or strange odor. If you have any of these signs, call your health care provider right away, even if it is before your due date. Follow these instructions at home: Medicines  Follow your health care provider's instructions regarding medicine use. Specific medicines may be either safe or unsafe to take during pregnancy.  Take a prenatal vitamin that contains at least 600 micrograms (mcg) of folic acid.  If you develop constipation, try taking a stool softener if your health care provider approves. Eating and drinking  Eat a balanced diet that includes fresh fruits and vegetables, whole grains, good sources of protein  such as meat, eggs, or tofu, and low-fat dairy. Your health care provider will help you determine the amount of weight gain that is right for you.  Avoid raw meat and uncooked cheese. These carry germs that can cause birth defects in the baby.  If you have low calcium intake from food, talk to your health care provider about whether you should take a daily calcium supplement.  Eat four or five small meals rather than three large meals a day.  Limit foods that are high in fat and processed sugars, such as fried and sweet foods.  To prevent constipation: ? Drink enough fluid to keep your urine clear or pale yellow. ? Eat foods that are high in fiber, such as fresh fruits and vegetables, whole grains, and beans. Activity  Exercise only as directed by your health care provider. Most women can continue their usual exercise routine during pregnancy. Try to exercise for 30 minutes at least 5 days a week. Stop exercising if you experience uterine contractions.  Avoid heavy   lifting.  Do not exercise in extreme heat or humidity, or at high altitudes.  Wear low-heel, comfortable shoes.  Practice good posture.  You may continue to have sex unless your health care provider tells you otherwise. Relieving pain and discomfort  Take frequent breaks and rest with your legs elevated if you have leg cramps or low back pain.  Take warm sitz baths to soothe any pain or discomfort caused by hemorrhoids. Use hemorrhoid cream if your health care provider approves.  Wear a good support bra to prevent discomfort from breast tenderness.  If you develop varicose veins: ? Wear support pantyhose or compression stockings as told by your healthcare provider. ? Elevate your feet for 15 minutes, 3-4 times a day. Prenatal care  Write down your questions. Take them to your prenatal visits.  Keep all your prenatal visits as told by your health care provider. This is important. Safety  Wear your seat belt at  all times when driving.  Make a list of emergency phone numbers, including numbers for family, friends, the hospital, and police and fire departments. General instructions  Avoid cat litter boxes and soil used by cats. These carry germs that can cause birth defects in the baby. If you have a cat, ask someone to clean the litter box for you.  Do not travel far distances unless it is absolutely necessary and only with the approval of your health care provider.  Do not use hot tubs, steam rooms, or saunas.  Do not drink alcohol.  Do not use any products that contain nicotine or tobacco, such as cigarettes and e-cigarettes. If you need help quitting, ask your health care provider.  Do not use any medicinal herbs or unprescribed drugs. These chemicals affect the formation and growth of the baby.  Do not douche or use tampons or scented sanitary pads.  Do not cross your legs for long periods of time.  To prepare for the arrival of your baby: ? Take prenatal classes to understand, practice, and ask questions about labor and delivery. ? Make a trial run to the hospital. ? Visit the hospital and tour the maternity area. ? Arrange for maternity or paternity leave through employers. ? Arrange for family and friends to take care of pets while you are in the hospital. ? Purchase a rear-facing car seat and make sure you know how to install it in your car. ? Pack your hospital bag. ? Prepare the baby's nursery. Make sure to remove all pillows and stuffed animals from the baby's crib to prevent suffocation.  Visit your dentist if you have not gone during your pregnancy. Use a soft toothbrush to brush your teeth and be gentle when you floss. Contact a health care provider if:  You are unsure if you are in labor or if your water has broken.  You become dizzy.  You have mild pelvic cramps, pelvic pressure, or nagging pain in your abdominal area.  You have lower back pain.  You have persistent  nausea, vomiting, or diarrhea.  You have an unusual or bad smelling vaginal discharge.  You have pain when you urinate. Get help right away if:  Your water breaks before 37 weeks.  You have regular contractions less than 5 minutes apart before 37 weeks.  You have a fever.  You are leaking fluid from your vagina.  You have spotting or bleeding from your vagina.  You have severe abdominal pain or cramping.  You have rapid weight loss or weight gain.    You have shortness of breath with chest pain.  You notice sudden or extreme swelling of your face, hands, ankles, feet, or legs.  Your baby makes fewer than 10 movements in 2 hours.  You have severe headaches that do not go away when you take medicine.  You have vision changes. Summary  The third trimester is from week 28 through week 40, months 7 through 9. The third trimester is a time when the unborn baby (fetus) is growing rapidly.  During the third trimester, your discomfort may increase as you and your baby continue to gain weight. You may have abdominal, leg, and back pain, sleeping problems, and an increased need to urinate.  During the third trimester your breasts will keep growing and they will continue to become tender. A yellow fluid (colostrum) may leak from your breasts. This is the first milk you are producing for your baby.  False labor is a condition in which you feel small, irregular tightenings of the muscles in the womb (contractions) that eventually go away. These are called Braxton Hicks contractions. Contractions may last for hours, days, or even weeks before true labor sets in.  Signs of labor can include: abdominal cramps; regular contractions that start at 10 minutes apart and become stronger and more frequent with time; watery or bloody mucus discharge that comes from the vagina; increased pelvic pressure and dull back pain; and leaking of amniotic fluid. This information is not intended to replace advice  given to you by your health care provider. Make sure you discuss any questions you have with your health care provider. Document Released: 03/23/2001 Document Revised: 09/04/2015 Document Reviewed: 05/30/2012 Elsevier Interactive Patient Education  2017 [employer]  

## 2016-08-24 NOTE — Progress Notes (Signed)
   PRENATAL VISIT NOTE  Subjective:  Rose [name] is a 26 y.o. [id number] at 7444w5d being seen today for ongoing prenatal care.  She is currently monitored for the following issues for this low-risk pregnancy and has Uterine scar from previous cesarean delivery affecting pregnancy; Bleeding in early pregnancy; Supervision of normal pregnancy, antepartum; Group B streptococcus urinary tract infection affecting pregnancy; Abdominal pain during pregnancy in second trimester; and UTI (urinary tract infection) during pregnancy, second trimester on her problem list.  Patient reports occasional contractions- ~2/hour. Was seen in MAU. Cervix closed. No worsening on UC's. Contractions: Irregular. Vag. Bleeding: None.  Movement: Present. Denies leaking of fluid.   The following portions of the patient's history were reviewed and updated as appropriate: allergies, current medications, past family history, past medical history, past social history, past surgical history and problem list. Problem list updated.  Objective:   Vitals:   08/24/16 0927  BP: (!) 125/54  Pulse: 86  Weight: 134 lb 12.8 oz (61.1 kg)    Fetal Status: Fetal Heart Rate (bpm): 136 Fundal Height: 30 cm Movement: Present  Presentation: Vertex  General:  Alert, oriented and cooperative. Patient is in no acute distress.  Skin: Skin is warm and dry. No rash noted.   Cardiovascular: Normal heart rate noted  Respiratory: Normal respiratory effort, no problems with respiration noted  Abdomen: Soft, gravid, appropriate for gestational age. Pain/Pressure: Present     Pelvic:  Cervical exam declined        Extremities: Normal range of motion.  Edema: None  Mental Status: Normal mood and affect. Normal behavior. Normal judgment and thought content.   Assessment and Plan:  Pregnancy: [id number] at 2944w5d  1. Encounter for supervision of other normal pregnancy in third trimester  - Glucose Tolerance, 2 Hours w/1 Hour - CBC - RPR - HIV  antibody  2. Seasonal allergic rhinitis due to pollen  - fluticasone (FLONASE ALLERGY RELIEF) 50 MCG/ACT nasal spray; Place 2 sprays into both nostrils daily.  Dispense: 9.9 g; Refill: 2  Preterm labor symptoms and general obstetric precautions including but not limited to vaginal bleeding, contractions, leaking of fluid and fetal movement were reviewed in detail with the patient. Please refer to After Visit Summary for other counseling recommendations.  Return in 2 weeks (on 09/07/2016).   Katrinka BlazingSmith, [place], CNM

## 2016-08-25 LAB — CBC
HEMATOCRIT: 26.1 % — AB (ref 34.0–46.6)
HEMOGLOBIN: 8.6 g/dL — AB (ref 11.1–15.9)
MCH: 27.5 pg (ref 26.6–33.0)
MCHC: 33 g/dL (ref 31.5–35.7)
MCV: 83 fL (ref 79–97)
Platelets: 273 10*3/uL (ref 150–379)
RBC: 3.13 x10E6/uL — AB (ref 3.77–5.28)
RDW: 14.7 % (ref 12.3–15.4)
WBC: 6 10*3/uL (ref 3.4–10.8)

## 2016-08-25 LAB — GLUCOSE TOLERANCE, 2 HOURS W/ 1HR
GLUCOSE, 1 HOUR: 117 mg/dL (ref 65–179)
Glucose, 2 hour: 94 mg/dL (ref 65–152)
Glucose, Fasting: 79 mg/dL (ref 65–91)

## 2016-08-25 LAB — RPR: RPR Ser Ql: NONREACTIVE

## 2016-08-25 LAB — HIV ANTIBODY (ROUTINE TESTING W REFLEX): HIV Screen 4th Generation wRfx: NONREACTIVE

## 2016-09-01 ENCOUNTER — Encounter: Payer: Self-pay | Admitting: General Practice

## 2016-09-01 MED ORDER — FERROUS SULFATE 325 (65 FE) MG PO TABS: 325.0000 mg | ORAL_TABLET | Freq: Three times a day (TID) | ORAL | 3 refills | Status: DC

## 2016-09-01 NOTE — Addendum Note (Signed)
Addended by: Kathee DeltonHILLMAN, Evalyse Stroope L on: 09/01/2016 04:37 PM   Modules accepted: Orders

## 2016-09-07 ENCOUNTER — Encounter: Payer: Self-pay | Admitting: Advanced Practice Midwife

## 2016-09-07 DIAGNOSIS — O99019 Anemia complicating pregnancy, unspecified trimester: Secondary | ICD-10-CM | POA: Insufficient documentation

## 2016-09-09 ENCOUNTER — Ambulatory Visit (INDEPENDENT_AMBULATORY_CARE_PROVIDER_SITE_OTHER): Payer: Medicaid Other | Admitting: Obstetrics and Gynecology

## 2016-09-09 ENCOUNTER — Encounter: Payer: Self-pay | Admitting: Obstetrics and Gynecology

## 2016-09-09 VITALS — BP 105/48 | HR 102 | Wt 136.5 lb

## 2016-09-09 DIAGNOSIS — O3429 Maternal care due to uterine scar from other previous surgery: Secondary | ICD-10-CM

## 2016-09-09 DIAGNOSIS — O34219 Maternal care for unspecified type scar from previous cesarean delivery: Secondary | ICD-10-CM

## 2016-09-09 DIAGNOSIS — Z3483 Encounter for supervision of other normal pregnancy, third trimester: Secondary | ICD-10-CM

## 2016-09-09 NOTE — Progress Notes (Signed)
   PRENATAL VISIT NOTE  Subjective:  Rose [name] is a 26 y.o. [id number] at 7751w0d being seen today for ongoing prenatal care.  She is currently monitored for the following issues for this low-risk pregnancy and has Uterine scar from previous cesarean delivery affecting pregnancy; Bleeding in early pregnancy; Supervision of normal pregnancy, antepartum; Group B streptococcus urinary tract infection affecting pregnancy; Abdominal pain during pregnancy in second trimester; UTI (urinary tract infection) during pregnancy, second trimester; and Anemia affecting pregnancy, antepartum on her problem list.  Patient reports no complaints.  Contractions: Irregular. Vag. Bleeding: None.  Movement: Present. Denies leaking of fluid.  Denies UTI symptoms but requests work note for frequent bathroom breaks due to pregnancy. Works at call center.  Undecided about sterilization but probably will sign Medicaid tubal papers at next visit.  The following portions of the patient's history were reviewed and updated as appropriate: allergies, current medications, past family history, past medical history, past social history, past surgical history and problem list. Problem list updated.  Objective:   Vitals:   09/09/16 0840  BP: (!) 105/48  Pulse: (!) 102  Weight: 61.9 kg (136 lb 8 oz)    Fetal Status: Fetal Heart Rate (bpm): 143   Movement: Present     General:  Alert, oriented and cooperative. Patient is in no acute distress.  Skin: Skin is warm and dry. No rash noted.   Cardiovascular: Normal heart rate noted  Respiratory: Normal respiratory effort, no problems with respiration noted  Abdomen: Soft, gravid, appropriate for gestational age. Pain/Pressure: Present     Pelvic:  Cervical exam deferred        Extremities: Normal range of motion.  Edema: None  Mental Status: Normal mood and affect. Normal behavior. Normal judgment and thought content.   Assessment and Plan:  Pregnancy: [id number] at 4551w0d Uterine  scar from previous cesarean delivery affecting pregnancy  Preterm labor symptoms and general obstetric precautions including but not limited to vaginal bleeding, contractions, leaking of fluid and fetal movement were reviewed in detail with the patient. Please refer to After Visit Summary for other counseling recommendations.  Return in about 2 weeks (around 09/23/2016).   Caren Griffinseirdre Brooklynn Brandenburg, CNM

## 2016-09-09 NOTE — Patient Instructions (Signed)
Third Trimester of Pregnancy The third trimester is from week 28 through week 40 (months 7 through 9). The third trimester is a time when the unborn baby (fetus) is growing rapidly. At the end of the ninth month, the fetus is about 20 inches in length and weighs 6-10 pounds. Body changes during your third trimester Your body will continue to go through many changes during pregnancy. The changes vary from woman to woman. During the third trimester:  Your weight will continue to increase. You can expect to gain 25-35 pounds (11-16 kg) by the end of the pregnancy.  You may begin to get stretch marks on your hips, abdomen, and breasts.  You may urinate more often because the fetus is moving lower into your pelvis and pressing on your bladder.  You may develop or continue to have heartburn. This is caused by increased hormones that slow down muscles in the digestive tract.  You may develop or continue to have constipation because increased hormones slow digestion and cause the muscles that push waste through your intestines to relax.  You may develop hemorrhoids. These are swollen veins (varicose veins) in the rectum that can itch or be painful.  You may develop swollen, bulging veins (varicose veins) in your legs.  You may have increased body aches in the pelvis, back, or thighs. This is due to weight gain and increased hormones that are relaxing your joints.  You may have changes in your hair. These can include thickening of your hair, rapid growth, and changes in texture. Some women also have hair loss during or after pregnancy, or hair that feels dry or thin. Your hair will most likely return to normal after your baby is born.  Your breasts will continue to grow and they will continue to become tender. A yellow fluid (colostrum) may leak from your breasts. This is the first milk you are producing for your baby.  Your belly button may stick out.  You may notice more swelling in your hands,  face, or ankles.  You may have increased tingling or numbness in your hands, arms, and legs. The skin on your belly may also feel numb.  You may feel short of breath because of your expanding uterus.  You may have more problems sleeping. This can be caused by the size of your belly, increased need to urinate, and an increase in your body's metabolism.  You may notice the fetus "dropping," or moving lower in your abdomen (lightening).  You may have increased vaginal discharge.  You may notice your joints feel loose and you may have pain around your pelvic bone.  What to expect at prenatal visits You will have prenatal exams every 2 weeks until week 36. Then you will have weekly prenatal exams. During a routine prenatal visit:  You will be weighed to make sure you and the baby are growing normally.  Your blood pressure will be taken.  Your abdomen will be measured to track your baby's growth.  The fetal heartbeat will be listened to.  Any test results from the previous visit will be discussed.  You may have a cervical check near your due date to see if your cervix has softened or thinned (effaced).  You will be tested for Group B streptococcus. This happens between 35 and 37 weeks.  Your health care provider may ask you:  What your birth plan is.  How you are feeling.  If you are feeling the baby move.  If you have had   any abnormal symptoms, such as leaking fluid, bleeding, severe headaches, or abdominal cramping.  If you are using any tobacco products, including cigarettes, chewing tobacco, and electronic cigarettes.  If you have any questions.  Other tests or screenings that may be performed during your third trimester include:  Blood tests that check for low iron levels (anemia).  Fetal testing to check the health, activity level, and growth of the fetus. Testing is done if you have certain medical conditions or if there are problems during the  pregnancy.  Nonstress test (NST). This test checks the health of your baby to make sure there are no signs of problems, such as the baby not getting enough oxygen. During this test, a belt is placed around your belly. The baby is made to move, and its heart rate is monitored during movement.  What is false labor? False labor is a condition in which you feel small, irregular tightenings of the muscles in the womb (contractions) that usually go away with rest, changing position, or drinking water. These are called Braxton Hicks contractions. Contractions may last for hours, days, or even weeks before true labor sets in. If contractions come at regular intervals, become more frequent, increase in intensity, or become painful, you should see your health care provider. What are the signs of labor?  Abdominal cramps.  Regular contractions that start at 10 minutes apart and become stronger and more frequent with time.  Contractions that start on the top of the uterus and spread down to the lower abdomen and back.  Increased pelvic pressure and dull back pain.  A watery or bloody mucus discharge that comes from the vagina.  Leaking of amniotic fluid. This is also known as your "water breaking." It could be a slow trickle or a gush. Let your health care provider know if it has a color or strange odor. If you have any of these signs, call your health care provider right away, even if it is before your due date. Follow these instructions at home: Medicines  Follow your health care provider's instructions regarding medicine use. Specific medicines may be either safe or unsafe to take during pregnancy.  Take a prenatal vitamin that contains at least 600 micrograms (mcg) of folic acid.  If you develop constipation, try taking a stool softener if your health care provider approves. Eating and drinking  Eat a balanced diet that includes fresh fruits and vegetables, whole grains, good sources of protein  such as meat, eggs, or tofu, and low-fat dairy. Your health care provider will help you determine the amount of weight gain that is right for you.  Avoid raw meat and uncooked cheese. These carry germs that can cause birth defects in the baby.  If you have low calcium intake from food, talk to your health care provider about whether you should take a daily calcium supplement.  Eat four or five small meals rather than three large meals a day.  Limit foods that are high in fat and processed sugars, such as fried and sweet foods.  To prevent constipation: ? Drink enough fluid to keep your urine clear or pale yellow. ? Eat foods that are high in fiber, such as fresh fruits and vegetables, whole grains, and beans. Activity  Exercise only as directed by your health care provider. Most women can continue their usual exercise routine during pregnancy. Try to exercise for 30 minutes at least 5 days a week. Stop exercising if you experience uterine contractions.  Avoid heavy   lifting.  Do not exercise in extreme heat or humidity, or at high altitudes.  Wear low-heel, comfortable shoes.  Practice good posture.  You may continue to have sex unless your health care provider tells you otherwise. Relieving pain and discomfort  Take frequent breaks and rest with your legs elevated if you have leg cramps or low back pain.  Take warm sitz baths to soothe any pain or discomfort caused by hemorrhoids. Use hemorrhoid cream if your health care provider approves.  Wear a good support bra to prevent discomfort from breast tenderness.  If you develop varicose veins: ? Wear support pantyhose or compression stockings as told by your healthcare provider. ? Elevate your feet for 15 minutes, 3-4 times a day. Prenatal care  Write down your questions. Take them to your prenatal visits.  Keep all your prenatal visits as told by your health care provider. This is important. Safety  Wear your seat belt at  all times when driving.  Make a list of emergency phone numbers, including numbers for family, friends, the hospital, and police and fire departments. General instructions  Avoid cat litter boxes and soil used by cats. These carry germs that can cause birth defects in the baby. If you have a cat, ask someone to clean the litter box for you.  Do not travel far distances unless it is absolutely necessary and only with the approval of your health care provider.  Do not use hot tubs, steam rooms, or saunas.  Do not drink alcohol.  Do not use any products that contain nicotine or tobacco, such as cigarettes and e-cigarettes. If you need help quitting, ask your health care provider.  Do not use any medicinal herbs or unprescribed drugs. These chemicals affect the formation and growth of the baby.  Do not douche or use tampons or scented sanitary pads.  Do not cross your legs for long periods of time.  To prepare for the arrival of your baby: ? Take prenatal classes to understand, practice, and ask questions about labor and delivery. ? Make a trial run to the hospital. ? Visit the hospital and tour the maternity area. ? Arrange for maternity or paternity leave through employers. ? Arrange for family and friends to take care of pets while you are in the hospital. ? Purchase a rear-facing car seat and make sure you know how to install it in your car. ? Pack your hospital bag. ? Prepare the baby's nursery. Make sure to remove all pillows and stuffed animals from the baby's crib to prevent suffocation.  Visit your dentist if you have not gone during your pregnancy. Use a soft toothbrush to brush your teeth and be gentle when you floss. Contact a health care provider if:  You are unsure if you are in labor or if your water has broken.  You become dizzy.  You have mild pelvic cramps, pelvic pressure, or nagging pain in your abdominal area.  You have lower back pain.  You have persistent  nausea, vomiting, or diarrhea.  You have an unusual or bad smelling vaginal discharge.  You have pain when you urinate. Get help right away if:  Your water breaks before 37 weeks.  You have regular contractions less than 5 minutes apart before 37 weeks.  You have a fever.  You are leaking fluid from your vagina.  You have spotting or bleeding from your vagina.  You have severe abdominal pain or cramping.  You have rapid weight loss or weight gain.    You have shortness of breath with chest pain.  You notice sudden or extreme swelling of your face, hands, ankles, feet, or legs.  Your baby makes fewer than 10 movements in 2 hours.  You have severe headaches that do not go away when you take medicine.  You have vision changes. Summary  The third trimester is from week 28 through week 40, months 7 through 9. The third trimester is a time when the unborn baby (fetus) is growing rapidly.  During the third trimester, your discomfort may increase as you and your baby continue to gain weight. You may have abdominal, leg, and back pain, sleeping problems, and an increased need to urinate.  During the third trimester your breasts will keep growing and they will continue to become tender. A yellow fluid (colostrum) may leak from your breasts. This is the first milk you are producing for your baby.  False labor is a condition in which you feel small, irregular tightenings of the muscles in the womb (contractions) that eventually go away. These are called Braxton Hicks contractions. Contractions may last for hours, days, or even weeks before true labor sets in.  Signs of labor can include: abdominal cramps; regular contractions that start at 10 minutes apart and become stronger and more frequent with time; watery or bloody mucus discharge that comes from the vagina; increased pelvic pressure and dull back pain; and leaking of amniotic fluid. This information is not intended to replace advice  given to you by your health care provider. Make sure you discuss any questions you have with your health care provider. Document Released: 03/23/2001 Document Revised: 09/04/2015 Document Reviewed: 05/30/2012 Elsevier Interactive Patient Education  2017 [employer]  

## 2016-09-16 ENCOUNTER — Encounter: Payer: Self-pay | Admitting: Family Medicine

## 2016-09-29 ENCOUNTER — Telehealth: Payer: Self-pay | Admitting: Obstetrics and Gynecology

## 2016-09-29 NOTE — Telephone Encounter (Signed)
Patient is calling to get an US scheduled.

## 2016-09-30 NOTE — Telephone Encounter (Signed)
Mychart message to patient advising her to discuss having an ultrasound tomorrow when she is seen.

## 2016-10-01 ENCOUNTER — Encounter: Payer: Self-pay | Admitting: Obstetrics and Gynecology

## 2016-10-01 ENCOUNTER — Ambulatory Visit (INDEPENDENT_AMBULATORY_CARE_PROVIDER_SITE_OTHER): Payer: Medicaid Other | Admitting: Obstetrics and Gynecology

## 2016-10-01 VITALS — BP 109/48 | HR 86 | Wt 133.6 lb

## 2016-10-01 DIAGNOSIS — O2343 Unspecified infection of urinary tract in pregnancy, third trimester: Secondary | ICD-10-CM

## 2016-10-01 DIAGNOSIS — O2342 Unspecified infection of urinary tract in pregnancy, second trimester: Secondary | ICD-10-CM

## 2016-10-01 DIAGNOSIS — Z3483 Encounter for supervision of other normal pregnancy, third trimester: Secondary | ICD-10-CM

## 2016-10-01 DIAGNOSIS — Z3480 Encounter for supervision of other normal pregnancy, unspecified trimester: Secondary | ICD-10-CM

## 2016-10-01 DIAGNOSIS — O34219 Maternal care for unspecified type scar from previous cesarean delivery: Secondary | ICD-10-CM

## 2016-10-01 DIAGNOSIS — O3429 Maternal care due to uterine scar from other previous surgery: Secondary | ICD-10-CM

## 2016-10-01 DIAGNOSIS — B951 Streptococcus, group B, as the cause of diseases classified elsewhere: Secondary | ICD-10-CM

## 2016-10-01 NOTE — Patient Instructions (Signed)
Third Trimester of Pregnancy The third trimester is from week 28 through week 40 (months 7 through 9). The third trimester is a time when the unborn baby (fetus) is growing rapidly. At the end of the ninth month, the fetus is about 20 inches in length and weighs 6-10 pounds. Body changes during your third trimester Your body will continue to go through many changes during pregnancy. The changes vary from woman to woman. During the third trimester:  Your weight will continue to increase. You can expect to gain 25-35 pounds (11-16 kg) by the end of the pregnancy.  You may begin to get stretch marks on your hips, abdomen, and breasts.  You may urinate more often because the fetus is moving lower into your pelvis and pressing on your bladder.  You may develop or continue to have heartburn. This is caused by increased hormones that slow down muscles in the digestive tract.  You may develop or continue to have constipation because increased hormones slow digestion and cause the muscles that push waste through your intestines to relax.  You may develop hemorrhoids. These are swollen veins (varicose veins) in the rectum that can itch or be painful.  You may develop swollen, bulging veins (varicose veins) in your legs.  You may have increased body aches in the pelvis, back, or thighs. This is due to weight gain and increased hormones that are relaxing your joints.  You may have changes in your hair. These can include thickening of your hair, rapid growth, and changes in texture. Some women also have hair loss during or after pregnancy, or hair that feels dry or thin. Your hair will most likely return to normal after your baby is born.  Your breasts will continue to grow and they will continue to become tender. A yellow fluid (colostrum) may leak from your breasts. This is the first milk you are producing for your baby.  Your belly button may stick out.  You may notice more swelling in your hands,  face, or ankles.  You may have increased tingling or numbness in your hands, arms, and legs. The skin on your belly may also feel numb.  You may feel short of breath because of your expanding uterus.  You may have more problems sleeping. This can be caused by the size of your belly, increased need to urinate, and an increase in your body's metabolism.  You may notice the fetus "dropping," or moving lower in your abdomen (lightening).  You may have increased vaginal discharge.  You may notice your joints feel loose and you may have pain around your pelvic bone.  What to expect at prenatal visits You will have prenatal exams every 2 weeks until week 36. Then you will have weekly prenatal exams. During a routine prenatal visit:  You will be weighed to make sure you and the baby are growing normally.  Your blood pressure will be taken.  Your abdomen will be measured to track your baby's growth.  The fetal heartbeat will be listened to.  Any test results from the previous visit will be discussed.  You may have a cervical check near your due date to see if your cervix has softened or thinned (effaced).  You will be tested for Group B streptococcus. This happens between 35 and 37 weeks.  Your health care provider may ask you:  What your birth plan is.  How you are feeling.  If you are feeling the baby move.  If you have had   any abnormal symptoms, such as leaking fluid, bleeding, severe headaches, or abdominal cramping.  If you are using any tobacco products, including cigarettes, chewing tobacco, and electronic cigarettes.  If you have any questions.  Other tests or screenings that may be performed during your third trimester include:  Blood tests that check for low iron levels (anemia).  Fetal testing to check the health, activity level, and growth of the fetus. Testing is done if you have certain medical conditions or if there are problems during the  pregnancy.  Nonstress test (NST). This test checks the health of your baby to make sure there are no signs of problems, such as the baby not getting enough oxygen. During this test, a belt is placed around your belly. The baby is made to move, and its heart rate is monitored during movement.  What is false labor? False labor is a condition in which you feel small, irregular tightenings of the muscles in the womb (contractions) that usually go away with rest, changing position, or drinking water. These are called Braxton Hicks contractions. Contractions may last for hours, days, or even weeks before true labor sets in. If contractions come at regular intervals, become more frequent, increase in intensity, or become painful, you should see your health care provider. What are the signs of labor?  Abdominal cramps.  Regular contractions that start at 10 minutes apart and become stronger and more frequent with time.  Contractions that start on the top of the uterus and spread down to the lower abdomen and back.  Increased pelvic pressure and dull back pain.  A watery or bloody mucus discharge that comes from the vagina.  Leaking of amniotic fluid. This is also known as your "water breaking." It could be a slow trickle or a gush. Let your health care provider know if it has a color or strange odor. If you have any of these signs, call your health care provider right away, even if it is before your due date. Follow these instructions at home: Medicines  Follow your health care provider's instructions regarding medicine use. Specific medicines may be either safe or unsafe to take during pregnancy.  Take a prenatal vitamin that contains at least 600 micrograms (mcg) of folic acid.  If you develop constipation, try taking a stool softener if your health care provider approves. Eating and drinking  Eat a balanced diet that includes fresh fruits and vegetables, whole grains, good sources of protein  such as meat, eggs, or tofu, and low-fat dairy. Your health care provider will help you determine the amount of weight gain that is right for you.  Avoid raw meat and uncooked cheese. These carry germs that can cause birth defects in the baby.  If you have low calcium intake from food, talk to your health care provider about whether you should take a daily calcium supplement.  Eat four or five small meals rather than three large meals a day.  Limit foods that are high in fat and processed sugars, such as fried and sweet foods.  To prevent constipation: ? Drink enough fluid to keep your urine clear or pale yellow. ? Eat foods that are high in fiber, such as fresh fruits and vegetables, whole grains, and beans. Activity  Exercise only as directed by your health care provider. Most women can continue their usual exercise routine during pregnancy. Try to exercise for 30 minutes at least 5 days a week. Stop exercising if you experience uterine contractions.  Avoid heavy   lifting.  Do not exercise in extreme heat or humidity, or at high altitudes.  Wear low-heel, comfortable shoes.  Practice good posture.  You may continue to have sex unless your health care provider tells you otherwise. Relieving pain and discomfort  Take frequent breaks and rest with your legs elevated if you have leg cramps or low back pain.  Take warm sitz baths to soothe any pain or discomfort caused by hemorrhoids. Use hemorrhoid cream if your health care provider approves.  Wear a good support bra to prevent discomfort from breast tenderness.  If you develop varicose veins: ? Wear support pantyhose or compression stockings as told by your healthcare provider. ? Elevate your feet for 15 minutes, 3-4 times a day. Prenatal care  Write down your questions. Take them to your prenatal visits.  Keep all your prenatal visits as told by your health care provider. This is important. Safety  Wear your seat belt at  all times when driving.  Make a list of emergency phone numbers, including numbers for family, friends, the hospital, and police and fire departments. General instructions  Avoid cat litter boxes and soil used by cats. These carry germs that can cause birth defects in the baby. If you have a cat, ask someone to clean the litter box for you.  Do not travel far distances unless it is absolutely necessary and only with the approval of your health care provider.  Do not use hot tubs, steam rooms, or saunas.  Do not drink alcohol.  Do not use any products that contain nicotine or tobacco, such as cigarettes and e-cigarettes. If you need help quitting, ask your health care provider.  Do not use any medicinal herbs or unprescribed drugs. These chemicals affect the formation and growth of the baby.  Do not douche or use tampons or scented sanitary pads.  Do not cross your legs for long periods of time.  To prepare for the arrival of your baby: ? Take prenatal classes to understand, practice, and ask questions about labor and delivery. ? Make a trial run to the hospital. ? Visit the hospital and tour the maternity area. ? Arrange for maternity or paternity leave through employers. ? Arrange for family and friends to take care of pets while you are in the hospital. ? Purchase a rear-facing car seat and make sure you know how to install it in your car. ? Pack your hospital bag. ? Prepare the baby's nursery. Make sure to remove all pillows and stuffed animals from the baby's crib to prevent suffocation.  Visit your dentist if you have not gone during your pregnancy. Use a soft toothbrush to brush your teeth and be gentle when you floss. Contact a health care provider if:  You are unsure if you are in labor or if your water has broken.  You become dizzy.  You have mild pelvic cramps, pelvic pressure, or nagging pain in your abdominal area.  You have lower back pain.  You have persistent  nausea, vomiting, or diarrhea.  You have an unusual or bad smelling vaginal discharge.  You have pain when you urinate. Get help right away if:  Your water breaks before 37 weeks.  You have regular contractions less than 5 minutes apart before 37 weeks.  You have a fever.  You are leaking fluid from your vagina.  You have spotting or bleeding from your vagina.  You have severe abdominal pain or cramping.  You have rapid weight loss or weight gain.    You have shortness of breath with chest pain.  You notice sudden or extreme swelling of your face, hands, ankles, feet, or legs.  Your baby makes fewer than 10 movements in 2 hours.  You have severe headaches that do not go away when you take medicine.  You have vision changes. Summary  The third trimester is from week 28 through week 40, months 7 through 9. The third trimester is a time when the unborn baby (fetus) is growing rapidly.  During the third trimester, your discomfort may increase as you and your baby continue to gain weight. You may have abdominal, leg, and back pain, sleeping problems, and an increased need to urinate.  During the third trimester your breasts will keep growing and they will continue to become tender. A yellow fluid (colostrum) may leak from your breasts. This is the first milk you are producing for your baby.  False labor is a condition in which you feel small, irregular tightenings of the muscles in the womb (contractions) that eventually go away. These are called Braxton Hicks contractions. Contractions may last for hours, days, or even weeks before true labor sets in.  Signs of labor can include: abdominal cramps; regular contractions that start at 10 minutes apart and become stronger and more frequent with time; watery or bloody mucus discharge that comes from the vagina; increased pelvic pressure and dull back pain; and leaking of amniotic fluid. This information is not intended to replace advice  given to you by your health care provider. Make sure you discuss any questions you have with your health care provider. Document Released: 03/23/2001 Document Revised: 09/04/2015 Document Reviewed: 05/30/2012 Elsevier Interactive Patient Education  2017 [employer]  

## 2016-10-01 NOTE — Progress Notes (Signed)
Subjective:  Rose [name] is a 26 y.o. [id number] at 4367w1d being seen today for ongoing prenatal care.  She is currently monitored for the following issues for this high-risk pregnancy and has Uterine scar from previous cesarean delivery affecting pregnancy; Supervision of normal pregnancy, antepartum; Group B streptococcus urinary tract infection affecting pregnancy; UTI (urinary tract infection) during pregnancy, second trimester; and Anemia affecting pregnancy, antepartum on her problem list.  Patient reports occasional contractions.  Contractions: Irregular.  .  Movement: Present. Denies leaking of fluid.   The following portions of the patient's history were reviewed and updated as appropriate: allergies, current medications, past family history, past medical history, past social history, past surgical history and problem list. Problem list updated.  Objective:   Vitals:   10/01/16 0812  BP: (!) 109/48  Pulse: 86  Weight: 133 lb 9.6 oz (60.6 kg)    Fetal Status: Fetal Heart Rate (bpm): 125   Movement: Present     General:  Alert, oriented and cooperative. Patient is in no acute distress.  Skin: Skin is warm and dry. No rash noted.   Cardiovascular: Normal heart rate noted  Respiratory: Normal respiratory effort, no problems with respiration noted  Abdomen: Soft, gravid, appropriate for gestational age. Pain/Pressure: Absent     Pelvic:  Cervical exam deferred        Extremities: Normal range of motion.     Mental Status: Normal mood and affect. Normal behavior. Normal judgment and thought content.   Urinalysis:      Assessment and Plan:  Pregnancy: [id number] at 1867w1d  1. Uterine scar from previous cesarean delivery affecting pregnancy C  Section x 4, will need repeat at 39 weeks Declines BTL. Information on LRAC's and Nexplanon provided to pt  2. Group B Streptococcus urinary tract infection affecting pregnancy in third trimester Tx while in labor  3. UTI (urinary tract  infection) during pregnancy, second trimester Repeat UC today  4. Supervision of other normal pregnancy, antepartum Stable  Preterm labor symptoms and general obstetric precautions including but not limited to vaginal bleeding, contractions, leaking of fluid and fetal movement were reviewed in detail with the patient. Please refer to After Visit Summary for other counseling recommendations.  Return in about 2 weeks (around 10/15/2016) for OB visit.   Rose StaggersErvin, [name], MD

## 2016-10-06 LAB — URINE CULTURE, OB REFLEX

## 2016-10-06 LAB — CULTURE, OB URINE

## 2016-10-08 ENCOUNTER — Encounter (HOSPITAL_COMMUNITY): Payer: Self-pay | Admitting: Obstetrics & Gynecology

## 2016-10-08 ENCOUNTER — Inpatient Hospital Stay (HOSPITAL_COMMUNITY)
Admission: AD | Admit: 2016-10-08 | Discharge: 2016-10-08 | Disposition: A | Discharge status: Home / Self Care | Payer: Medicaid Other | Source: Ambulatory Visit | Attending: Obstetrics & Gynecology | Admitting: Obstetrics & Gynecology

## 2016-10-08 DIAGNOSIS — O2343 Unspecified infection of urinary tract in pregnancy, third trimester: Secondary | ICD-10-CM | POA: Diagnosis not present

## 2016-10-08 DIAGNOSIS — O26893 Other specified pregnancy related conditions, third trimester: Secondary | ICD-10-CM | POA: Diagnosis present

## 2016-10-08 DIAGNOSIS — O99013 Anemia complicating pregnancy, third trimester: Secondary | ICD-10-CM | POA: Insufficient documentation

## 2016-10-08 DIAGNOSIS — O34219 Maternal care for unspecified type scar from previous cesarean delivery: Secondary | ICD-10-CM | POA: Diagnosis not present

## 2016-10-08 DIAGNOSIS — Z3A35 35 weeks gestation of pregnancy: Secondary | ICD-10-CM | POA: Insufficient documentation

## 2016-10-08 DIAGNOSIS — D649 Anemia, unspecified: Secondary | ICD-10-CM | POA: Insufficient documentation

## 2016-10-08 DIAGNOSIS — O4703 False labor before 37 completed weeks of gestation, third trimester: Secondary | ICD-10-CM

## 2016-10-08 DIAGNOSIS — R102 Pelvic and perineal pain: Secondary | ICD-10-CM | POA: Diagnosis present

## 2016-10-08 NOTE — MAU Note (Signed)
Contractions all day

## 2016-10-08 NOTE — Discharge Instructions (Signed)

## 2016-10-08 NOTE — MAU Provider Note (Signed)
Obstetric Attending MAU Note  Chief Complaint:  Contractions   First Provider Initiated Contact with Patient 10/08/16 1930     HPI: Rose [name] is a 26 y.o. [id number] at [redacted]w[redacted]d who presents to maternity admissions reporting contractions very 20 minutes. Denies leakage of fluid or vaginal bleeding. Good fetal movement.  Also denies any abnormal vaginal discharge, fevers, chills, sweats, dysuria, nausea, vomiting, other GI or GU symptoms or other general symptoms.  Pregnancy Course: Receives care at [facility] Patient Active Problem List   Diagnosis Date Noted  . Anemia affecting pregnancy, antepartum 09/07/2016  . UTI (urinary tract infection) during pregnancy, second trimester 08/18/2016  . Group B streptococcus urinary tract infection affecting pregnancy 07/22/2016  . Supervision of normal pregnancy, antepartum 06/17/2016  . Uterine scar from previous cesarean delivery affecting pregnancy 04/11/2016    Past Medical History:  Diagnosis Date  . Abdominal pain during pregnancy in second trimester 08/18/2016  . Chlamydia   . Infection    UTI  . Trichomonosis    hx  . Urinary tract infection   . UTI (urinary tract infection) during pregnancy, second trimester 08/18/2016    OB History  Gravida Para Term Preterm AB Living  6 4 4  0 1 4  SAB TAB Ectopic Multiple Live Births  1 0 0 0 4    # Outcome Date GA Lbr Len/2nd Weight Sex Delivery Anes PTL Lv  6 Current           5 Term 2016     CS-LTranv   LIV  4 Term 04/03/13 [redacted]w[redacted]d  6 lb 2.4 oz (2.79 kg) M CS-Vac Spinal  LIV  3 SAB 2014          2 Term 02/02/11 [redacted]w[redacted]d  7 lb 3.3 oz (3.27 kg) F CS-Vac Spinal  LIV     Birth Comments: scheduled repeat  1 Term 2010 [redacted]w[redacted]d  9 lb 2 oz (4.139 kg) M CS-LTranv   LIV     Birth Comments: transverse lie      Past Surgical History:  Procedure Laterality Date  . CESAREAN SECTION  2010  . CESAREAN SECTION  02/02/2011   Procedure: CESAREAN SECTION;  Surgeon: Kathreen Cosier, MD;  Location: [facility];   Service: Gynecology;  Laterality: N/A;  Repeat  . CESAREAN SECTION N/A 04/03/2013   Procedure: REPEAT CESAREAN SECTION;  Surgeon: Kathreen Cosier, MD;  Location: [facility];  Service: Obstetrics;  Laterality: N/A;  X2    Family History: Family History  Problem Relation Age of Onset  . Diabetes Paternal Grandmother   . Hypertension Paternal Grandmother   . Cancer Paternal Grandmother        breast  . Cancer Paternal Grandfather        brain  . Hypertension Father   . Diabetes Father   . Heart disease Father   . Hypertension Mother   . Diabetes Mother   . Hearing loss Neg Hx     Social History: Social History  Substance Use Topics  . Smoking status: Never Smoker  . Smokeless tobacco: Never Used  . Alcohol use No    Allergies: No Known Allergies  Prescriptions Prior to Admission  Medication Sig Dispense Refill Last Dose  . ferrous sulfate 325 (65 FE) MG tablet Take 1 tablet (325 mg total) by mouth 3 (three) times daily with meals. 90 tablet 3 Taking  . fluticasone (FLONASE ALLERGY RELIEF) 50 MCG/ACT nasal spray Place 2 sprays into both nostrils daily. 9.9 g 2  Taking  . Prenatal Multivit-Min-Fe-FA (PRENATAL VITAMINS) 0.8 MG tablet Take 1 tablet by mouth daily. 30 tablet 12 Taking    ROS: Pertinent findings in history of present illness.  Physical Exam  Last menstrual period 02/05/2016, unknown if currently breastfeeding. CONSTITUTIONAL: Well-developed, well-nourished female in no acute distress.  HENT:  Normocephalic, atraumatic, External right and left ear normal. Oropharynx is clear and moist EYES: Conjunctivae and EOM are normal. Pupils are equal, round, and reactive to light. No scleral icterus.  NECK: Normal range of motion, supple, no masses SKIN: Skin is warm and dry. No rash noted. Not diaphoretic. No erythema. No pallor. NEUROLGIC: Alert and oriented to person, place, and time. Normal reflexes, muscle tone coordination. No cranial nerve deficit noted. PSYCHIATRIC:  Normal mood and affect. Normal behavior. Normal judgment and thought content. CARDIOVASCULAR: Normal heart rate noted, regular rhythm RESPIRATORY: Effort and breath sounds normal, no problems with respiration noted ABDOMEN: Soft, nontender, nondistended, gravid appropriate for gestational age MUSCULOSKELETAL: Normal range of motion. No edema and no tenderness. 2+ distal pulses.  PELVIC: NEFG, physiologic discharge, no blood, cervix closed/thick/high   FHT:  Baseline 150 , moderate variability, accelerations present, no decelerations Contractions: No contractions   Labs: No results found for this or any previous visit (from the past 24 hour(s)).  Imaging:  No results found.  MAU Course: No intervention done other than FHR and tocometry monitoring  Assessment: 1. Preterm uterine contractions in third trimester, antepartum     Plan: Discharge home Preterm labor precautions and fetal kick counts reviewed Follow up with OB provider as scheduled  Follow-up Information    [facility].   Specialty:  Obstetrics and Gynecology Why:  12:40 pm for appointment as scheduled. Contact information: [address] [place] [id number] [phone]          Allergies as of 10/08/2016   No Known Allergies     Medication List    TAKE these medications   ferrous sulfate 325 (65 FE) MG tablet Take 1 tablet (325 mg total) by mouth 3 (three) times daily with meals.   fluticasone 50 MCG/ACT nasal spray Commonly known as:  FLONASE ALLERGY RELIEF Place 2 sprays into both nostrils daily.   Prenatal Vitamins 0.8 MG tablet Take 1 tablet by mouth daily.       Tereso NewcomerAnyanwu, Gaius Ishaq A, MD 10/08/2016 7:47 PM

## 2016-10-08 NOTE — MAU Note (Signed)
Urine in lab 

## 2016-10-12 ENCOUNTER — Encounter: Payer: Medicaid Other | Admitting: Advanced Practice Midwife

## 2016-10-12 ENCOUNTER — Encounter: Payer: Self-pay | Admitting: Family Medicine

## 2016-10-14 ENCOUNTER — Encounter (HOSPITAL_COMMUNITY): Payer: Self-pay | Admitting: *Deleted

## 2016-10-14 ENCOUNTER — Inpatient Hospital Stay (HOSPITAL_COMMUNITY)
Admission: AD | Admit: 2016-10-14 | Discharge: 2016-10-14 | Disposition: A | Discharge status: Home / Self Care | Payer: Medicaid Other | Source: Ambulatory Visit | Attending: Obstetrics and Gynecology | Admitting: Obstetrics and Gynecology

## 2016-10-14 DIAGNOSIS — Z3689 Encounter for other specified antenatal screening: Secondary | ICD-10-CM

## 2016-10-14 DIAGNOSIS — Z3A36 36 weeks gestation of pregnancy: Secondary | ICD-10-CM | POA: Diagnosis not present

## 2016-10-14 DIAGNOSIS — R102 Pelvic and perineal pain: Secondary | ICD-10-CM | POA: Diagnosis not present

## 2016-10-14 DIAGNOSIS — O26893 Other specified pregnancy related conditions, third trimester: Secondary | ICD-10-CM | POA: Insufficient documentation

## 2016-10-14 LAB — WET PREP, GENITAL
Clue Cells Wet Prep HPF POC: NONE SEEN
Sperm: NONE SEEN
Trich, Wet Prep: NONE SEEN
Yeast Wet Prep HPF POC: NONE SEEN

## 2016-10-14 LAB — URINALYSIS, ROUTINE W REFLEX MICROSCOPIC
BILIRUBIN URINE: NEGATIVE
GLUCOSE, UA: NEGATIVE mg/dL
HGB URINE DIPSTICK: NEGATIVE
KETONES UR: 80 mg/dL — AB
NITRITE: NEGATIVE
PROTEIN: 30 mg/dL — AB
Specific Gravity, Urine: 1.025 (ref 1.005–1.030)
pH: 5 (ref 5.0–8.0)

## 2016-10-14 MED ORDER — COMFORT FIT MATERNITY SUPP MED MISC: 1.0000 | Freq: Every day | 0 refills | Status: DC

## 2016-10-14 NOTE — MAU Note (Signed)
+  vaginal pressure and pain Has been going on but has gotten worse States it hurts so bad its hard to walk Tried tylenol 3 hours ago with no relief (patient scrolling through phone during triage)--rating pain 10/10 currently that is constant , worse with activity Denies LOF or VB +Fm

## 2016-10-14 NOTE — Discharge Instructions (Signed)

## 2016-10-14 NOTE — MAU Provider Note (Signed)
Chief Complaint:  Pelvic Pain    HPI: Rose [name] is a 26 y.o. [id number] at [redacted]w[redacted]d who presents to maternity admissions reporting pelvic and low abdomen pain since yesterday.  Right now the worst of her pain is in both hips, but she also has pain across her low abdomen.  The pain is described as constant, sharp at times, and rated a 10/10.  It is aggravated with walking and sitting up to get out of bed. It is sometimes relieved with sitting.  She has tried Tylenol without relief. She denies urinary symptoms, changes in bowel movements.  She also denies contractions, leaking of fluid, vaginal bleeding, and vaginal irritation.  She reports good fetal movement.   Pregnancy Course:   Past Medical History:  Diagnosis Date  . Abdominal pain during pregnancy in second trimester 08/18/2016  . Chlamydia   . Infection    UTI  . Trichomonosis    hx  . Urinary tract infection   . UTI (urinary tract infection) during pregnancy, second trimester 08/18/2016   OB History  Gravida Para Term Preterm AB Living  6 4 4  0 1 4  SAB TAB Ectopic Multiple Live Births  1 0 0 0 4    # Outcome Date GA Lbr Len/2nd Weight Sex Delivery Anes PTL Lv  6 Current           5 Term 2016     CS-LTranv   LIV  4 Term 04/03/13 [redacted]w[redacted]d  2.79 kg (6 lb 2.4 oz) M CS-Vac Spinal  LIV  3 SAB 2014          2 Term 02/02/11 [redacted]w[redacted]d  3.27 kg (7 lb 3.3 oz) F CS-Vac Spinal  LIV     Birth Comments: scheduled repeat  1 Term 2010 [redacted]w[redacted]d  4.139 kg (9 lb 2 oz) M CS-LTranv   LIV     Birth Comments: transverse lie     Past Surgical History:  Procedure Laterality Date  . CESAREAN SECTION  2010  . CESAREAN SECTION  02/02/2011   Procedure: CESAREAN SECTION;  Surgeon: Kathreen Cosier, MD;  Location: [facility];  Service: Gynecology;  Laterality: N/A;  Repeat  . CESAREAN SECTION N/A 04/03/2013   Procedure: REPEAT CESAREAN SECTION;  Surgeon: Kathreen Cosier, MD;  Location: [facility];  Service: Obstetrics;  Laterality: N/A;  X2   Family History   Problem Relation Age of Onset  . Diabetes Paternal Grandmother   . Hypertension Paternal Grandmother   . Cancer Paternal Grandmother        breast  . Cancer Paternal Grandfather        brain  . Hypertension Father   . Diabetes Father   . Heart disease Father   . Hypertension Mother   . Diabetes Mother   . Hearing loss Neg Hx    Social History  Substance Use Topics  . Smoking status: Never Smoker  . Smokeless tobacco: Never Used  . Alcohol use No   No Known Allergies Prescriptions Prior to Admission  Medication Sig Dispense Refill Last Dose  . ferrous sulfate 325 (65 FE) MG tablet Take 1 tablet (325 mg total) by mouth 3 (three) times daily with meals. 90 tablet 3 Taking  . fluticasone (FLONASE ALLERGY RELIEF) 50 MCG/ACT nasal spray Place 2 sprays into both nostrils daily. 9.9 g 2 Taking  . Prenatal Multivit-Min-Fe-FA (PRENATAL VITAMINS) 0.8 MG tablet Take 1 tablet by mouth daily. 30 tablet 12 Taking    I have reviewed patient's  Past Medical Hx, Surgical Hx, Family Hx, Social Hx, medications and allergies.   ROS:  Review of Systems  Constitutional: Negative for chills and fever.  Gastrointestinal: Positive for abdominal pain. Negative for constipation, diarrhea, nausea and vomiting.  Genitourinary: Positive for pelvic pain. Negative for dysuria, frequency, hematuria, vaginal bleeding, vaginal discharge and vaginal pain.  Musculoskeletal: Positive for gait problem.       Positive for Right and left hip pain, Positive for pain with walking    Physical Exam  Patient Vitals for the past 24 hrs:  BP Temp Temp src Pulse Resp SpO2  10/14/16 1829 (!) 108/54 98.8 F (37.1 C) Oral 100 16 99 %   Constitutional: Well-developed, well-nourished female in no acute distress. Vitals are normal. Cardiovascular: normal rate, regular rhythm; no murmurs Respiratory: normal breathing effort; CTAB; No respiratory distress GI: Abd non-tender, gravid appropriate for gestational age.  Normoactive BS all 4 quadrants. MS: Extremities nontender, no edema; Pain with hip abduction Neurologic: Alert and oriented x3. Normal affect and behavior. GU: Neg CVAT.  Pelvic: NEFG, Thin milky discharge in vaginal canal; some thicker white accumulation on cervix; no blood, No CMT     FHT:  Baseline 130 bpm, moderate variability, accelerations present, no decelerations Contractions: irregular   Labs: Results for orders placed or performed during the hospital encounter of 10/14/16 (from the past 24 hour(s))  Urinalysis, Routine w reflex microscopic     Status: Abnormal   Collection Time: 10/14/16  6:21 PM  Result Value Ref Range   Color, Urine YELLOW YELLOW   APPearance CLEAR CLEAR   Specific Gravity, Urine 1.025 1.005 - 1.030   pH 5.0 5.0 - 8.0   Glucose, UA NEGATIVE NEGATIVE mg/dL   Hgb urine dipstick NEGATIVE NEGATIVE   Bilirubin Urine NEGATIVE NEGATIVE   Ketones, ur 80 (A) NEGATIVE mg/dL   Protein, ur 30 (A) NEGATIVE mg/dL   Nitrite NEGATIVE NEGATIVE   Leukocytes, UA MODERATE (A) NEGATIVE   RBC / HPF 0-5 0 - 5 RBC/hpf   WBC, UA TOO NUMEROUS TO COUNT 0 - 5 WBC/hpf   Bacteria, UA RARE (A) NONE SEEN   Squamous Epithelial / LPF 0-5 (A) NONE SEEN   Mucous PRESENT   Wet prep, genital     Status: Abnormal   Collection Time: 10/14/16  7:20 PM  Result Value Ref Range   Yeast Wet Prep HPF POC NONE SEEN NONE SEEN   Trich, Wet Prep NONE SEEN NONE SEEN   Clue Cells Wet Prep HPF POC NONE SEEN NONE SEEN   WBC, Wet Prep HPF POC MODERATE (A) NONE SEEN   Sperm NONE SEEN     Imaging:  No results found.  MAU Course: Orders Placed This Encounter  Procedures  . Wet prep, genital  . Urinalysis, Routine w reflex microscopic     Romona Curls 10/14/2016 6:51 PM   MDM: No evidence of UTI, pelvic infection, or PTL. Pain likely physiologic to fetal engagement and lack of pelvic support d/t multip status. Discussed comfort measures. Discussed ways to improve hydration.  Stable for discharge home.   A/P: 1. [redacted] weeks gestation of pregnancy   2. NST (non-stress test) reactive   3. Pelvic pain affecting pregnancy in third trimester, antepartum    Discharge home Follow up in OB office as scheduled Rx maternity support Warm baths Tylenol prn  Allergies as of 10/14/2016   No Known Allergies     Medication List    TAKE these medications  COMFORT FIT MATERNITY SUPP MED Misc 1 Device by Does not apply route daily.   ferrous sulfate 325 (65 FE) MG tablet Take 1 tablet (325 mg total) by mouth 3 (three) times daily with meals.   fluticasone 50 MCG/ACT nasal spray Commonly known as:  FLONASE ALLERGY RELIEF Place 2 sprays into both nostrils daily.   Prenatal Vitamins 0.8 MG tablet Take 1 tablet by mouth daily.      I confirm that I have verified the information documented in the student's note and that I have also personally reperformed the physical exam and all medical decision making activities.  Donette LarryBhambri, Lashundra Shiveley, CNM  10/14/2016 7:41 PM

## 2016-10-15 LAB — GC/CHLAMYDIA PROBE AMP (~~LOC~~) NOT AT ARMC
CHLAMYDIA, DNA PROBE: NEGATIVE
Neisseria Gonorrhea: NEGATIVE

## 2016-10-19 ENCOUNTER — Ambulatory Visit (INDEPENDENT_AMBULATORY_CARE_PROVIDER_SITE_OTHER): Payer: Medicaid Other | Admitting: Obstetrics and Gynecology

## 2016-10-19 VITALS — BP 116/58 | HR 101 | Wt 134.9 lb

## 2016-10-19 DIAGNOSIS — O3429 Maternal care due to uterine scar from other previous surgery: Secondary | ICD-10-CM

## 2016-10-19 DIAGNOSIS — Z3483 Encounter for supervision of other normal pregnancy, third trimester: Secondary | ICD-10-CM

## 2016-10-19 DIAGNOSIS — O2343 Unspecified infection of urinary tract in pregnancy, third trimester: Secondary | ICD-10-CM

## 2016-10-19 DIAGNOSIS — O34219 Maternal care for unspecified type scar from previous cesarean delivery: Secondary | ICD-10-CM

## 2016-10-19 DIAGNOSIS — B951 Streptococcus, group B, as the cause of diseases classified elsewhere: Secondary | ICD-10-CM

## 2016-10-19 DIAGNOSIS — Z3480 Encounter for supervision of other normal pregnancy, unspecified trimester: Secondary | ICD-10-CM

## 2016-10-19 NOTE — Progress Notes (Signed)
   PRENATAL VISIT NOTE  Subjective:  Rose [name] is a 26 y.o. [id number] at 543w5d being seen today for ongoing prenatal care.  She is currently monitored for the following issues for this low-risk pregnancy and has Uterine scar from previous cesarean delivery affecting pregnancy; Supervision of normal pregnancy, antepartum; Group B streptococcus urinary tract infection affecting pregnancy; UTI (urinary tract infection) during pregnancy, second trimester; and Anemia affecting pregnancy, antepartum on her problem list.  Patient reports Hip pain .  Contractions: Irregular. Vag. Bleeding: None.  Movement: Present. Denies leaking of fluid.   The following portions of the patient's history were reviewed and updated as appropriate: allergies, current medications, past family history, past medical history, past social history, past surgical history and problem list. Problem list updated.  Objective:   Vitals:   10/19/16 1040  BP: (!) 116/58  Pulse: (!) 101  Weight: 134 lb 14.4 oz (61.2 kg)    Fetal Status: Fetal Heart Rate (bpm): 138 Fundal Height: 36 cm Movement: Present     General:  Alert, oriented and cooperative. Patient is in no acute distress.  Skin: Skin is warm and dry. No rash noted.   Cardiovascular: Normal heart rate noted  Respiratory: Normal respiratory effort, no problems with respiration noted  Abdomen: Soft, gravid, appropriate for gestational age. Pain/Pressure: Present     Pelvic:  Cervical exam deferred        Extremities: Normal range of motion.  Edema: None  Mental Status: Normal mood and affect. Normal behavior. Normal judgment and thought content.   Assessment and Plan:  Pregnancy: [id number] at 613w5d  1. Supervision of other normal pregnancy, antepartum  - Patient to be scheduled with MD next visit for scheduling of repeat Cesarean section.  - GC collected on 7/5 and was negative.  - Patient now undecided about BTL   2. Group B Streptococcus urinary tract  infection affecting pregnancy in third trimester  Will treat in labor.   3. Uterine scar from previous cesarean delivery affecting pregnancy - repeat C section.   There are no diagnoses linked to this encounter. Preterm labor symptoms and general obstetric precautions including but not limited to vaginal bleeding, contractions, leaking of fluid and fetal movement were reviewed in detail with the patient. Please refer to After Visit Summary for other counseling recommendations.  Return in about 1 week (around 10/26/2016) for please schedule with MD for scheduling of C-section .   Legrande Hao, Harolyn RutherfordJennifer I, NP

## 2016-10-19 NOTE — Progress Notes (Signed)
Elevated score on PHQ9, stated that she doesn't need to see behavior clinician.

## 2016-10-25 ENCOUNTER — Encounter: Payer: Medicaid Other | Admitting: Obstetrics and Gynecology

## 2016-10-27 ENCOUNTER — Ambulatory Visit (INDEPENDENT_AMBULATORY_CARE_PROVIDER_SITE_OTHER): Payer: Medicaid Other | Admitting: Obstetrics & Gynecology

## 2016-10-27 ENCOUNTER — Encounter: Payer: Self-pay | Admitting: Obstetrics & Gynecology

## 2016-10-27 VITALS — BP 103/59 | HR 92 | Wt 135.4 lb

## 2016-10-27 DIAGNOSIS — O34219 Maternal care for unspecified type scar from previous cesarean delivery: Secondary | ICD-10-CM

## 2016-10-27 DIAGNOSIS — Z3480 Encounter for supervision of other normal pregnancy, unspecified trimester: Secondary | ICD-10-CM

## 2016-10-27 DIAGNOSIS — O3429 Maternal care due to uterine scar from other previous surgery: Secondary | ICD-10-CM

## 2016-10-27 DIAGNOSIS — Z3483 Encounter for supervision of other normal pregnancy, third trimester: Secondary | ICD-10-CM

## 2016-10-27 NOTE — Patient Instructions (Signed)
Cesarean Delivery °Cesarean birth, or cesarean delivery, is the surgical delivery of a baby through an incision in the abdomen and the uterus. This may be referred to as a C-section. This procedure may be scheduled ahead of time, or it may be done in an emergency situation. °Tell a health care provider about: °· Any allergies you have. °· All medicines you are taking, including vitamins, herbs, eye drops, creams, and over-the-counter medicines. °· Any problems you or family members have had with anesthetic medicines. °· Any blood disorders you have. °· Any surgeries you have had. °· Any medical conditions you have. °· Whether you or any members of your family have a history of deep vein thrombosis (DVT) or pulmonary embolism (PE). °What are the risks? °Generally, this is a safe procedure. However, problems may occur, including: °· Infection. °· Bleeding. °· Allergic reactions to medicines. °· Damage to other structures or organs. °· Blood clots. °· Injury to your baby. ° °What happens before the procedure? °· Follow instructions from your health care provider about eating or drinking restrictions. °· Follow instructions from your health care provider about bathing before your procedure to help reduce your risk of infection. °· If you know that you are going to have a cesarean delivery, do not shave your pubic area. Shaving before the procedure may increase your risk of infection. °· Ask your health care provider about: °? Changing or stopping your regular medicines. This is especially important if you are taking diabetes medicines or blood thinners. °? Your pain management plan. This is especially important if you plan to breastfeed your baby. °? How long you will be in the hospital after the procedure. °? Any concerns you may have about receiving blood products if you need them during the procedure. °? Cord blood banking, if you plan to collect your baby’s umbilical cord blood. °· You may also want to ask your  health care provider: °? Whether you will be able to hold or breastfeed your baby while you are still in the operating room. °? Whether your baby can stay with you immediately after the procedure and during your recovery. °? Whether a family member or a person of your choice can go with you into the operating room and stay with you during the procedure, immediately after the procedure, and during your recovery. °· Plan to have someone drive you home when you are discharged from the hospital. °What happens during the procedure? °· Fetal monitors will be placed on your abdomen to monitor your heart rate and your baby's heart rate. °· Depending on the reason for your cesarean delivery, you may have a physical exam or additional testing, such as an ultrasound. °· An IV tube will be inserted into one of your veins. °· You may have your blood or urine tested. °· You will be given antibiotic medicine to help prevent infection. °· You may be given a special warming gown to wear to keep your temperature stable. °· Hair may be removed from your pubic area. °· The skin of your pubic area and lower abdomen will be cleaned with a germ-killing solution (antiseptic). °· A catheter may be inserted into your bladder through your urethra. This drains your urine during the procedure. °· You may be given one or more of the following: °? A medicine to numb the area (local anesthetic). °? A medicine to make you fall asleep (general anesthetic). °? A medicine (regional anesthetic) that is injected into your back or through a small   thin tube placed in your back (spinal anesthetic or epidural anesthetic). This numbs everything below the injection site and allows you to stay awake during your procedure. If this makes you feel nauseous, tell your health care provider. Medicines will be available to help reduce any nausea you may feel. °· An incision will be made in your abdomen, and then in your uterus. °· If you are awake during your  procedure, you may feel tugging and pulling in your abdomen, but you should not feel pain. If you feel pain, tell your health care provider immediately. °· Your baby will be removed from your uterus. You may feel more pressure or pushing while this happens. °· Immediately after birth, your baby will be dried and kept warm. You may be able to hold and breastfeed your baby. The umbilical cord may be clamped and cut during this time. °· Your placenta will be removed from your uterus. °· Your incisions will be closed with stitches (sutures). Staples, skin glue, or adhesive strips may also be applied to the incision in your abdomen. °· Bandages (dressings) will be placed over the incision in your abdomen. °The procedure may vary among health care providers and hospitals. °What happens after the procedure? °· Your blood pressure, heart rate, breathing rate, and blood oxygen level will be monitored often until the medicines you were given have worn off. °· You may continue to receive fluids and medicines through an IV tube. °· You will have some pain. Medicines will be available to help control your pain. °· To help prevent blood clots: °? You may be given medicines. °? You may have to wear compression stockings or devices. °? You will be encouraged to walk around when you are able. °· Hospital staff will encourage and support bonding with your baby. Your hospital may allow you and your baby to stay in the same room (rooming in) during your hospital stay to encourage successful breastfeeding. °· You may be encouraged to cough and breathe deeply often. This helps to prevent lung problems. °· If you have a catheter draining your urine, it will be removed as soon as possible after your procedure. °This information is not intended to replace advice given to you by your health care provider. Make sure you discuss any questions you have with your health care provider. °Document Released: 03/29/2005 Document Revised: 09/04/2015  Document Reviewed: 01/07/2015 °Elsevier Interactive Patient Education © 2017 [employer] ° °

## 2016-10-27 NOTE — Progress Notes (Signed)
   PRENATAL VISIT NOTE  Subjective:  Rose [name] is a 26 y.o. [id number] at 3663w6d being seen today for ongoing prenatal care.  She is currently monitored for the following issues for this low-risk pregnancy and has Uterine scar from previous cesarean delivery affecting pregnancy; Supervision of normal pregnancy, antepartum; Group B streptococcus urinary tract infection affecting pregnancy; and Anemia affecting pregnancy, antepartum on her problem list.  Patient reports no complaints.  Contractions: Irregular. Vag. Bleeding: None.  Movement: Present. Denies leaking of fluid.   The following portions of the patient's history were reviewed and updated as appropriate: allergies, current medications, past family history, past medical history, past social history, past surgical history and problem list. Problem list updated.  Objective:   Vitals:   10/27/16 1304  BP: (!) 103/59  Pulse: 92  Weight: 135 lb 6.4 oz (61.4 kg)    Fetal Status: Fetal Heart Rate (bpm): 137 Fundal Height: 38 cm Movement: Present     General:  Alert, oriented and cooperative. Patient is in no acute distress.  Skin: Skin is warm and dry. No rash noted.   Cardiovascular: Normal heart rate noted  Respiratory: Normal respiratory effort, no problems with respiration noted  Abdomen: Soft, gravid, appropriate for gestational age.  Pain/Pressure: Present     Pelvic: Cervical exam deferred        Extremities: Normal range of motion.  Edema: None  Mental Status:  Normal mood and affect. Normal behavior. Normal judgment and thought content.   Assessment and Plan:  Pregnancy: [id number] at 7563w6d  1. Uterine scar from previous cesarean delivery affecting pregnancy Repeat cesarean section scheduled on 11/05/16 at 1430 with Dr. Vergie LivingPickens, preoperative instructions given.  Orders placed.   2. Supervision of other normal pregnancy, antepartum Term labor symptoms and general obstetric precautions including but not limited to vaginal  bleeding, contractions, leaking of fluid and fetal movement were reviewed in detail with the patient. Please refer to After Visit Summary for other counseling recommendations.  Return in about 5 weeks (around 12/01/2016) for Postpartum check .   Jaynie CollinsUgonna Leshea Jaggers, MD

## 2016-10-28 ENCOUNTER — Encounter (HOSPITAL_COMMUNITY): Payer: Self-pay

## 2016-11-01 ENCOUNTER — Other Ambulatory Visit (HOSPITAL_COMMUNITY): Payer: Self-pay | Admitting: Certified Nurse Midwife

## 2016-11-04 ENCOUNTER — Encounter: Payer: Self-pay | Admitting: Obstetrics and Gynecology

## 2016-11-04 ENCOUNTER — Encounter (HOSPITAL_COMMUNITY)
Admission: RE | Admit: 2016-11-04 | Discharge: 2016-11-04 | Disposition: A | Discharge status: Home / Self Care | Payer: Medicaid Other | Source: Ambulatory Visit | Attending: Obstetrics and Gynecology | Admitting: Obstetrics and Gynecology

## 2016-11-04 LAB — CBC
HCT: 29.9 % — ABNORMAL LOW (ref 36.0–46.0)
Hemoglobin: 9.3 g/dL — ABNORMAL LOW (ref 12.0–15.0)
MCH: 24.9 pg — AB (ref 26.0–34.0)
MCHC: 31.1 g/dL (ref 30.0–36.0)
MCV: 79.9 fL (ref 78.0–100.0)
PLATELETS: 249 10*3/uL (ref 150–400)
RBC: 3.74 MIL/uL — ABNORMAL LOW (ref 3.87–5.11)
RDW: 15.7 % — AB (ref 11.5–15.5)
WBC: 7.1 10*3/uL (ref 4.0–10.5)

## 2016-11-04 LAB — TYPE AND SCREEN
ABO/RH(D): B POS
Antibody Screen: NEGATIVE

## 2016-11-04 MED ORDER — CEFAZOLIN SODIUM-DEXTROSE 2-4 GM/100ML-% IV SOLN: 2.0000 g | INTRAVENOUS | Status: DC

## 2016-11-04 NOTE — Patient Instructions (Signed)
20 Edithe A Musco  11/04/2016   Your procedure is scheduled on:  11/05/2016  Enter through the Main Entrance of [facility] at 1245 PM.  Pick up the phone at the desk and dial [phone].   Call this number if you have problems the morning of surgery: [phone]   Remember:   Do not eat food:After Midnight.  Do not drink clear liquids: After Midnight.  Take these medicines the morning of surgery with A SIP OF WATER: none   Do not wear jewelry, make-up or nail polish.  Do not wear lotions, powders, or perfumes. Do not wear deodorant.  Do not shave 48 hours prior to surgery.  Do not bring valuables to the hospital.  [facility] is not   responsible for any belongings or valuables brought to the hospital.  Contacts, dentures or bridgework may not be worn into surgery.  Leave suitcase in the car. After surgery it may be brought to your room.  For patients admitted to the hospital, checkout time is 11:00 AM the day of              discharge.   Patients discharged the day of surgery will not be allowed to drive             home.  Name and phone number of your driver: na  Special Instructions:   N/A   Please read over the following fact sheets that you were given:   Surgical Site Infection Prevention

## 2016-11-05 ENCOUNTER — Inpatient Hospital Stay (HOSPITAL_COMMUNITY): Payer: Medicaid Other | Admitting: Certified Registered Nurse Anesthetist

## 2016-11-05 ENCOUNTER — Encounter (HOSPITAL_COMMUNITY): Payer: Self-pay | Admitting: Certified Registered Nurse Anesthetist

## 2016-11-05 ENCOUNTER — Encounter (HOSPITAL_COMMUNITY)
Admission: RE | Disposition: A | Discharge status: Home / Self Care | Payer: Self-pay | Source: Ambulatory Visit | Attending: Obstetrics and Gynecology

## 2016-11-05 ENCOUNTER — Inpatient Hospital Stay (HOSPITAL_COMMUNITY)
Admission: RE | Admit: 2016-11-05 | Discharge: 2016-11-08 | DRG: 766 | Disposition: A | Discharge status: Home / Self Care | Payer: Medicaid Other | Source: Ambulatory Visit | Attending: Obstetrics and Gynecology | Admitting: Obstetrics and Gynecology

## 2016-11-05 DIAGNOSIS — O99824 Streptococcus B carrier state complicating childbirth: Secondary | ICD-10-CM | POA: Diagnosis present

## 2016-11-05 DIAGNOSIS — O34211 Maternal care for low transverse scar from previous cesarean delivery: Principal | ICD-10-CM | POA: Diagnosis present

## 2016-11-05 DIAGNOSIS — Z98891 History of uterine scar from previous surgery: Secondary | ICD-10-CM

## 2016-11-05 DIAGNOSIS — O34219 Maternal care for unspecified type scar from previous cesarean delivery: Secondary | ICD-10-CM

## 2016-11-05 DIAGNOSIS — Z3A39 39 weeks gestation of pregnancy: Secondary | ICD-10-CM | POA: Diagnosis not present

## 2016-11-05 DIAGNOSIS — Z3480 Encounter for supervision of other normal pregnancy, unspecified trimester: Secondary | ICD-10-CM

## 2016-11-05 DIAGNOSIS — O2343 Unspecified infection of urinary tract in pregnancy, third trimester: Secondary | ICD-10-CM

## 2016-11-05 DIAGNOSIS — O9902 Anemia complicating childbirth: Secondary | ICD-10-CM | POA: Diagnosis present

## 2016-11-05 DIAGNOSIS — D649 Anemia, unspecified: Secondary | ICD-10-CM | POA: Diagnosis present

## 2016-11-05 DIAGNOSIS — O99019 Anemia complicating pregnancy, unspecified trimester: Secondary | ICD-10-CM

## 2016-11-05 DIAGNOSIS — B951 Streptococcus, group B, as the cause of diseases classified elsewhere: Secondary | ICD-10-CM

## 2016-11-05 DIAGNOSIS — O99013 Anemia complicating pregnancy, third trimester: Secondary | ICD-10-CM

## 2016-11-05 LAB — RPR: RPR Ser Ql: NONREACTIVE

## 2016-11-05 SURGERY — Surgical Case
Anesthesia: Spinal

## 2016-11-05 MED ORDER — NALOXONE HCL 2 MG/2ML IJ SOSY
1.0000 ug/kg/h | PREFILLED_SYRINGE | INTRAVENOUS | Status: DC | PRN
  Filled 2016-11-05: qty 2

## 2016-11-05 MED ORDER — SODIUM CHLORIDE 0.9% FLUSH: 3.0000 mL | INTRAVENOUS | Status: DC | PRN

## 2016-11-05 MED ORDER — SCOPOLAMINE 1 MG/3DAYS TD PT72
1.0000 | MEDICATED_PATCH | Freq: Once | TRANSDERMAL | Status: DC
  Filled 2016-11-05: qty 1

## 2016-11-05 MED ORDER — OXYCODONE HCL 5 MG PO TABS: 10.0000 mg | ORAL_TABLET | ORAL | Status: DC | PRN

## 2016-11-05 MED ORDER — FENTANYL CITRATE (PF) 100 MCG/2ML IJ SOLN
INTRAMUSCULAR | Status: AC
  Filled 2016-11-05: qty 2

## 2016-11-05 MED ORDER — CEFAZOLIN SODIUM-DEXTROSE 2-3 GM-% IV SOLR
INTRAVENOUS | Status: DC | PRN
  Administered 2016-11-05: 2 g via INTRAVENOUS

## 2016-11-05 MED ORDER — NALBUPHINE HCL 10 MG/ML IJ SOLN: 5.0000 mg | INTRAMUSCULAR | Status: DC | PRN

## 2016-11-05 MED ORDER — FENTANYL CITRATE (PF) 100 MCG/2ML IJ SOLN
INTRAMUSCULAR | Status: DC | PRN
  Administered 2016-11-05: 25 ug via INTRAVENOUS
  Administered 2016-11-05: 25 ug via INTRATHECAL
  Administered 2016-11-05: 25 ug via INTRAVENOUS
  Administered 2016-11-05: 50 ug via INTRAVENOUS

## 2016-11-05 MED ORDER — FENTANYL CITRATE (PF) 100 MCG/2ML IJ SOLN
INTRAMUSCULAR | Status: AC
  Administered 2016-11-05: 25 ug via INTRAVENOUS
  Filled 2016-11-05: qty 2

## 2016-11-05 MED ORDER — NALBUPHINE HCL 10 MG/ML IJ SOLN: 5.0000 mg | Freq: Once | INTRAMUSCULAR | Status: DC | PRN

## 2016-11-05 MED ORDER — BUPIVACAINE IN DEXTROSE 0.75-8.25 % IT SOLN
INTRATHECAL | Status: AC
  Filled 2016-11-05: qty 2

## 2016-11-05 MED ORDER — MENTHOL 3 MG MT LOZG: 1.0000 | LOZENGE | OROMUCOSAL | Status: DC | PRN

## 2016-11-05 MED ORDER — BUPIVACAINE IN DEXTROSE 0.75-8.25 % IT SOLN
INTRATHECAL | Status: DC | PRN
  Administered 2016-11-05: 1.1 mL via INTRATHECAL

## 2016-11-05 MED ORDER — NALBUPHINE HCL 10 MG/ML IJ SOLN
5.0000 mg | INTRAMUSCULAR | Status: DC | PRN
  Administered 2016-11-05: 5 mg via INTRAVENOUS
  Filled 2016-11-05: qty 1

## 2016-11-05 MED ORDER — DIPHENHYDRAMINE HCL 25 MG PO CAPS: 25.0000 mg | ORAL_CAPSULE | Freq: Four times a day (QID) | ORAL | Status: DC | PRN

## 2016-11-05 MED ORDER — PHENYLEPHRINE 8 MG IN D5W 100 ML (0.08MG/ML) PREMIX OPTIME
INJECTION | INTRAVENOUS | Status: DC | PRN
Start: 2016-11-05 — End: 2016-11-05
  Administered 2016-11-05: 60 ug/min via INTRAVENOUS

## 2016-11-05 MED ORDER — OXYCODONE HCL 5 MG PO TABS
5.0000 mg | ORAL_TABLET | ORAL | Status: DC | PRN
  Administered 2016-11-07 – 2016-11-08 (×2): 5 mg via ORAL
  Filled 2016-11-05 (×2): qty 1

## 2016-11-05 MED ORDER — MORPHINE SULFATE (PF) 0.5 MG/ML IJ SOLN
INTRAMUSCULAR | Status: AC
  Filled 2016-11-05: qty 10

## 2016-11-05 MED ORDER — ONDANSETRON HCL 4 MG/2ML IJ SOLN
INTRAMUSCULAR | Status: DC | PRN
  Administered 2016-11-05: 4 mg via INTRAVENOUS

## 2016-11-05 MED ORDER — MIDAZOLAM HCL 2 MG/2ML IJ SOLN
INTRAMUSCULAR | Status: DC | PRN
  Administered 2016-11-05 (×2): 1 mg via INTRAVENOUS

## 2016-11-05 MED ORDER — SENNOSIDES-DOCUSATE SODIUM 8.6-50 MG PO TABS
2.0000 | ORAL_TABLET | ORAL | Status: DC
  Administered 2016-11-05 – 2016-11-08 (×3): 2 via ORAL
  Filled 2016-11-05 (×3): qty 2

## 2016-11-05 MED ORDER — WITCH HAZEL-GLYCERIN EX PADS: 1.0000 "application " | MEDICATED_PAD | CUTANEOUS | Status: DC | PRN

## 2016-11-05 MED ORDER — LACTATED RINGERS IV SOLN
INTRAVENOUS | Status: DC | PRN
  Administered 2016-11-05 (×2): via INTRAVENOUS

## 2016-11-05 MED ORDER — DIPHENHYDRAMINE HCL 25 MG PO CAPS
25.0000 mg | ORAL_CAPSULE | ORAL | Status: DC | PRN
Start: 2016-11-05 — End: 2016-11-08

## 2016-11-05 MED ORDER — DIBUCAINE 1 % RE OINT: 1.0000 "application " | TOPICAL_OINTMENT | RECTAL | Status: DC | PRN

## 2016-11-05 MED ORDER — SIMETHICONE 80 MG PO CHEW
80.0000 mg | CHEWABLE_TABLET | ORAL | Status: DC
  Administered 2016-11-05 – 2016-11-08 (×3): 80 mg via ORAL
  Filled 2016-11-05 (×3): qty 1

## 2016-11-05 MED ORDER — TETANUS-DIPHTH-ACELL PERTUSSIS 5-2.5-18.5 LF-MCG/0.5 IM SUSP: 0.5000 mL | Freq: Once | INTRAMUSCULAR | Status: DC

## 2016-11-05 MED ORDER — SODIUM CHLORIDE 0.9 % IR SOLN
Status: DC | PRN
  Administered 2016-11-05: 1000 mL

## 2016-11-05 MED ORDER — IBUPROFEN 600 MG PO TABS
600.0000 mg | ORAL_TABLET | Freq: Four times a day (QID) | ORAL | Status: DC
  Administered 2016-11-05 – 2016-11-08 (×11): 600 mg via ORAL
  Filled 2016-11-05 (×12): qty 1

## 2016-11-05 MED ORDER — NALOXONE HCL 0.4 MG/ML IJ SOLN: 0.4000 mg | INTRAMUSCULAR | Status: DC | PRN

## 2016-11-05 MED ORDER — ZOLPIDEM TARTRATE 5 MG PO TABS: 5.0000 mg | ORAL_TABLET | Freq: Every evening | ORAL | Status: DC | PRN

## 2016-11-05 MED ORDER — ONDANSETRON HCL 4 MG/2ML IJ SOLN
INTRAMUSCULAR | Status: AC
  Filled 2016-11-05: qty 2

## 2016-11-05 MED ORDER — FENTANYL CITRATE (PF) 100 MCG/2ML IJ SOLN
25.0000 ug | INTRAMUSCULAR | Status: DC | PRN
  Administered 2016-11-05: 50 ug via INTRAVENOUS
  Administered 2016-11-05 (×2): 25 ug via INTRAVENOUS

## 2016-11-05 MED ORDER — ACETAMINOPHEN 325 MG PO TABS
650.0000 mg | ORAL_TABLET | ORAL | Status: DC | PRN
  Administered 2016-11-07 – 2016-11-08 (×4): 650 mg via ORAL
  Filled 2016-11-05 (×4): qty 2

## 2016-11-05 MED ORDER — LACTATED RINGERS IV SOLN
INTRAVENOUS | Status: DC | PRN
  Administered 2016-11-05: 17:00:00 via INTRAVENOUS

## 2016-11-05 MED ORDER — PRENATAL MULTIVITAMIN CH
1.0000 | ORAL_TABLET | Freq: Every day | ORAL | Status: DC
  Administered 2016-11-06 – 2016-11-08 (×3): 1 via ORAL
  Filled 2016-11-05 (×3): qty 1

## 2016-11-05 MED ORDER — ONDANSETRON HCL 4 MG/2ML IJ SOLN: 4.0000 mg | Freq: Three times a day (TID) | INTRAMUSCULAR | Status: DC | PRN

## 2016-11-05 MED ORDER — MORPHINE SULFATE (PF) 0.5 MG/ML IJ SOLN
INTRAMUSCULAR | Status: DC | PRN
  Administered 2016-11-05: .1 mg via INTRATHECAL

## 2016-11-05 MED ORDER — SIMETHICONE 80 MG PO CHEW: 80.0000 mg | CHEWABLE_TABLET | ORAL | Status: DC | PRN

## 2016-11-05 MED ORDER — OXYTOCIN 10 UNIT/ML IJ SOLN
INTRAMUSCULAR | Status: AC
  Filled 2016-11-05: qty 4

## 2016-11-05 MED ORDER — ACETAMINOPHEN 500 MG PO TABS
1000.0000 mg | ORAL_TABLET | Freq: Four times a day (QID) | ORAL | Status: AC
  Administered 2016-11-05 – 2016-11-06 (×4): 1000 mg via ORAL
  Filled 2016-11-05 (×4): qty 2

## 2016-11-05 MED ORDER — KETOROLAC TROMETHAMINE 30 MG/ML IJ SOLN: 30.0000 mg | Freq: Four times a day (QID) | INTRAMUSCULAR | Status: AC | PRN

## 2016-11-05 MED ORDER — SIMETHICONE 80 MG PO CHEW
80.0000 mg | CHEWABLE_TABLET | Freq: Three times a day (TID) | ORAL | Status: DC
  Administered 2016-11-06 – 2016-11-08 (×4): 80 mg via ORAL
  Filled 2016-11-05 (×5): qty 1

## 2016-11-05 MED ORDER — MIDAZOLAM HCL 2 MG/2ML IJ SOLN
INTRAMUSCULAR | Status: AC
  Filled 2016-11-05: qty 2

## 2016-11-05 MED ORDER — LACTATED RINGERS IV SOLN
INTRAVENOUS | Status: DC
  Administered 2016-11-05: 125 mL/h via INTRAVENOUS

## 2016-11-05 MED ORDER — PHENYLEPHRINE 8 MG IN D5W 100 ML (0.08MG/ML) PREMIX OPTIME
INJECTION | INTRAVENOUS | Status: AC
  Filled 2016-11-05: qty 100

## 2016-11-05 MED ORDER — OXYTOCIN 10 UNIT/ML IJ SOLN
INTRAMUSCULAR | Status: DC | PRN
  Administered 2016-11-05: 40 [IU] via INTRAVENOUS

## 2016-11-05 MED ORDER — DIPHENHYDRAMINE HCL 50 MG/ML IJ SOLN: 12.5000 mg | INTRAMUSCULAR | Status: DC | PRN

## 2016-11-05 MED ORDER — OXYTOCIN 40 UNITS IN LACTATED RINGERS INFUSION - SIMPLE MED: 2.5000 [IU]/h | INTRAVENOUS | Status: AC

## 2016-11-05 MED ORDER — LACTATED RINGERS IV SOLN
INTRAVENOUS | Status: DC
  Administered 2016-11-05: 14:00:00 via INTRAVENOUS

## 2016-11-05 MED ORDER — COCONUT OIL OIL
1.0000 "application " | TOPICAL_OIL | Status: DC | PRN
  Administered 2016-11-08: 1 via TOPICAL
  Filled 2016-11-05: qty 120

## 2016-11-05 SURGICAL SUPPLY — 35 items
ADH SKN CLS APL DERMABOND .7 (GAUZE/BANDAGES/DRESSINGS) ×1
CANISTER SUCT 3000ML PPV (MISCELLANEOUS) ×3 IMPLANT
CHLORAPREP W/TINT 26ML (MISCELLANEOUS) ×3 IMPLANT
DERMABOND ADVANCED (GAUZE/BANDAGES/DRESSINGS) ×2
DERMABOND ADVANCED .7 DNX12 (GAUZE/BANDAGES/DRESSINGS) ×1 IMPLANT
DRESSING DISP NPWT PICO 4X12 (MISCELLANEOUS) ×2 IMPLANT
DRSG OPSITE POSTOP 4X10 (GAUZE/BANDAGES/DRESSINGS) ×3 IMPLANT
ELECT REM PT RETURN 9FT ADLT (ELECTROSURGICAL) ×3
ELECTRODE REM PT RTRN 9FT ADLT (ELECTROSURGICAL) ×1 IMPLANT
EXTRACTOR VACUUM KIWI (MISCELLANEOUS) ×3 IMPLANT
GLOVE BIOGEL PI IND STRL 7.0 (GLOVE) ×2 IMPLANT
GLOVE BIOGEL PI IND STRL 7.5 (GLOVE) ×1 IMPLANT
GLOVE BIOGEL PI INDICATOR 7.0 (GLOVE) ×4
GLOVE BIOGEL PI INDICATOR 7.5 (GLOVE) ×2
GLOVE SKINSENSE NS SZ7.0 (GLOVE) ×2
GLOVE SKINSENSE STRL SZ7.0 (GLOVE) ×1 IMPLANT
GOWN STRL REUS W/ TWL LRG LVL3 (GOWN DISPOSABLE) ×2 IMPLANT
GOWN STRL REUS W/ TWL XL LVL3 (GOWN DISPOSABLE) ×1 IMPLANT
GOWN STRL REUS W/TWL LRG LVL3 (GOWN DISPOSABLE) ×6
GOWN STRL REUS W/TWL XL LVL3 (GOWN DISPOSABLE) ×3
NS IRRIG 1000ML POUR BTL (IV SOLUTION) ×3 IMPLANT
PACK C SECTION WH (CUSTOM PROCEDURE TRAY) ×3 IMPLANT
PAD OB MATERNITY 4.3X12.25 (PERSONAL CARE ITEMS) ×3 IMPLANT
PAD PREP 24X48 CUFFED NSTRL (MISCELLANEOUS) ×3 IMPLANT
RTRCTR C-SECT PINK 25CM LRG (MISCELLANEOUS) ×2 IMPLANT
SPONGE LAP 18X18 X RAY DECT (DISPOSABLE) ×6 IMPLANT
SUT CHROMIC 0 CTX 36 (SUTURE) ×2 IMPLANT
SUT MON AB 4-0 PS1 27 (SUTURE) ×3 IMPLANT
SUT MON AB-0 CT1 36 (SUTURE) ×8 IMPLANT
SUT PDS AB 0 CTX 60 (SUTURE) ×2 IMPLANT
SUT PLAIN 2 0 (SUTURE) ×3
SUT PLAIN ABS 2-0 CT1 27XMFL (SUTURE) ×1 IMPLANT
SUT VIC AB 0 CT1 36 (SUTURE) ×6 IMPLANT
SUT VIC AB 3-0 CT1 27 (SUTURE) ×3
SUT VIC AB 3-0 CT1 TAPERPNT 27 (SUTURE) ×1 IMPLANT

## 2016-11-05 NOTE — Op Note (Signed)
Luxe A Sonneborn PROCEDURE DATE: 11/05/2016  PREOPERATIVE DIAGNOSES: Intrauterine pregnancy at 2861w1d weeks gestation; repeat c-section x5  POSTOPERATIVE DIAGNOSES: The same  PROCEDURE: Repeat Low Transverse Cesarean Section  SURGEON:  Dr. [place] Bingharlie Karron Alvizo  ASSISTANT:  Dr. Caryl AdaJazma Phelps  ANESTHESIOLOGY TEAM: Anesthesiologist: Cristela BlueJackson, Kyle, MD; Cecile Hearingurk, Stephen Edward, MD CRNA: Yolonda Kidaarver, Alison L, CRNA  INDICATIONS: Rose [name] is a 26 y.o. [phone] 0516G6P5015 at 2961w1d here for cesarean section secondary to the indications listed under preoperative diagnoses; please see preoperative note for further details.  The risks of cesarean section were discussed with the patient including but were not limited to: bleeding which may require transfusion or reoperation; infection which may require antibiotics; injury to bowel, bladder, ureters or other surrounding organs; injury to the fetus; need for additional procedures including hysterectomy in the event of a life-threatening hemorrhage; placental abnormalities wth subsequent pregnancies, incisional problems, thromboembolic phenomenon and other postoperative/anesthesia complications.   The patient concurred with the proposed plan, giving informed written consent for the procedure.    FINDINGS:  Viable female infant in cephalic presentation.  Apgars 8 and 8.  Meconium stained amniotic fluid.  Intact placenta, three vessel cord.  Normal uterus, fallopian tubes and ovaries bilaterally. Appendix visualized and normal.  patient had minimal adhesions. 3070gm  ANESTHESIA: Spinal INTRAVENOUS FLUIDS: 2500 ml   ESTIMATED BLOOD LOSS: 800 ml URINE OUTPUT:  200 ml SPECIMENS: Placenta sent to L&D COMPLICATIONS: None immediate  PROCEDURE IN DETAIL:  The patient preoperatively received intravenous antibiotics and had sequential compression devices applied to her lower extremities.  She was then taken to the operating room where spinal anesthesia was administered and was found to be  adequate. She was then placed in a dorsal supine position with a leftward tilt, and prepped and draped in a sterile manner.  A foley catheter was placed into her bladder and attached to constant gravity.  After an adequate timeout was performed, a Pfannenstiel skin incision was made with scalpel over her preexisting scar and carried through to the underlying layer of fascia. The fascia was incised in the midline, and this incision was extended bilaterally using the Mayo scissors.  Kocher clamps were applied to the superior aspect of the fascial incision and the underlying rectus muscles were dissected off sharply.  A similar process was carried out on the inferior aspect of the fascial incision. The rectus muscles were separated in the midline sharply and the peritoneum was entered bluntly. Attention was turned to the lower uterine segment. A bladder flap was created.  Alexa was placed to retract abdominal content. A low transverse hysterotomy was made with a scalpel and extended bilaterally bluntly.  The infant was successfully delivered, the cord was clamped and cut after one minute, and the infant was handed over to the awaiting neonatology team. Uterine massage was then administered, and the placenta delivered intact with a three-vessel cord. The uterus was then cleared of clots and debris.  The hysterotomy was closed with 0 Monocryl in a running locked fashion, and an imbricating layer was also placed with 0 Monocryl. The pelvis was cleared of all clot and debris and irrigated. 2 and 3-0 vicryl to place figure of eight sutures on the right rectus muscle and peritoneum to help with hemostasis.  Hemostasis was confirmed on all surfaces.  The fascia was then closed using 0 PDS in a running fashion.  The subcutaneous layer was irrigated. The skin was closed with stables.  A pico dressing was then applied. The patient tolerated the  procedure well. Sponge, lap, instrument and needle counts were correct x 3.  She  was taken to the recovery room in stable condition.   Caryl AdaJazma Phelps, DO OB Fellow Faculty Practice, [facility] [place] 11/05/2016, 6:19 PM   Agree with above. I was present and scrubbed for the entire procedure.   Cornelia Copaharlie Finlay Godbee, Jr MD Attending Center for [facility] Midwife(Faculty Practice)

## 2016-11-05 NOTE — Anesthesia Postprocedure Evaluation (Signed)
Anesthesia Post Note  Patient: Rose [name]  Procedure(s) Performed: Procedure(s) (LRB): CESAREAN SECTION (N/A)     Patient location during evaluation: PACU Anesthesia Type: Spinal Level of consciousness: oriented and awake and alert Pain management: pain level controlled Vital Signs Assessment: post-procedure vital signs reviewed and stable Respiratory status: spontaneous breathing, respiratory function stable and patient connected to nasal cannula oxygen Cardiovascular status: blood pressure returned to baseline and stable Postop Assessment: no headache, no backache and spinal receding Anesthetic complications: no    Last Vitals:  Vitals:   11/05/16 1930 11/05/16 1947  BP: (!) 112/92 (!) 116/46  Pulse: (!) 113 93  Resp: (!) 25 18  Temp:  36.7 C    Last Pain:  Vitals:   11/05/16 1930  TempSrc:   PainSc: 3    Pain Goal: Patients Stated Pain Goal: 4 (11/05/16 1326)               Cecile HearingStephen Edward Teanna Elem

## 2016-11-05 NOTE — Transfer of Care (Signed)
Immediate Anesthesia Transfer of Care Note  Patient: Rose [name]  Procedure(s) Performed: Procedure(s): CESAREAN SECTION (N/A)  Patient Location: PACU  Anesthesia Type:Spinal  Level of Consciousness: awake, alert , oriented and patient cooperative  Airway & Oxygen Therapy: Patient Spontanous Breathing  Post-op Assessment: Report given to RN and Post -op Vital signs reviewed and stable  Post vital signs: Reviewed and stable  Last Vitals:  Vitals:   11/05/16 1326  BP: 119/62  Pulse: 85  Resp: 18  Temp: 37.1 C    Last Pain:  Vitals:   11/05/16 1326  TempSrc: Oral      Patients Stated Pain Goal: 4 (11/05/16 1326)  Complications: No apparent anesthesia complications

## 2016-11-05 NOTE — Anesthesia Procedure Notes (Signed)
Spinal  Patient location during procedure: OR Staffing Anesthesiologist: Cristela BlueJACKSON, Luc Shammas Preanesthetic Checklist Completed: patient identified, site marked, surgical consent, pre-op evaluation, timeout performed, IV checked, risks and benefits discussed and monitors and equipment checked Spinal Block Patient position: sitting Prep: DuraPrep Patient monitoring: heart rate, cardiac monitor, continuous pulse ox and blood pressure Approach: midline Location: L3-4 Injection technique: single-shot Needle Needle type: Sprotte  Needle gauge: 24 G Needle length: 9 cm Assessment Sensory level: T4 Additional Notes Spinal Dosage in OR  .75% Bupivicaine ml       1.1     [id number]   mcg        100    Fentanyl mcg            25

## 2016-11-05 NOTE — Consult Note (Signed)
[facility]  Delivery Note:  C-section       11/05/2016  4:48 PM  I was called to the operating room at the request of the patient's obstetrician (Dr. Vergie LivingPickens) for a repeat c-section.  PRENATAL HX:  This is a 26 y/o [id number] at 7739 and 1/[redacted] weeks gestation who presents for repeat c-section.  Her pregnancy has been complicated by a GBS UTI and anemia.  AROM at delivery so no GBS prophylaxis is indicated.    DELIVERY:  Infant was vigorous at delivery, requiring no resuscitation other than standard warming, drying and stimulation.  A pulse oximeter was applied at 5 minutes of age for central cyanosis and was 75%, but quickly rose to 85%.  By 10 minutes oxygen saturations were in the low 90s.  APGARs 8, 8 and 9.  Exam within normal limits.  After 10 minutes, baby left with nurse to assist parents with skin-to-skin care.   _____________________ Electronically Signed By: Maryan CharLindsey Jubal Rademaker, MD Neonatologist

## 2016-11-05 NOTE — H&P (Signed)
Obstetric Preoperative History and Physical  Rose [name] is a 26 y.o. [id number] with IUP at [redacted]w[redacted]d presenting for scheduled cesarean section.  This will be repeat c-section x5. No acute concerns.   Prenatal Course [facility] of Care: [facility]  with onset of care at 19 weeks Pregnancy complications or risks: Patient Active Problem List   Diagnosis Date Noted  . Anemia affecting pregnancy, antepartum 09/07/2016  . Group B streptococcus urinary tract infection affecting pregnancy 07/22/2016  . Supervision of normal pregnancy, antepartum 06/17/2016  . Uterine scar from previous cesarean delivery affecting pregnancy 04/11/2016   She plans to breastfeed She desires Depo-Provera for postpartum contraception.   Prenatal labs and studies: ABO, Rh: --/--/B POS (07/26 1521) Antibody: NEG (07/26 1521) Rubella: 4.28 (03/08 0854) RPR: Non Reactive (07/26 1521)  HBsAg: Negative (03/08 0854)  HIV:  Non-Reactive   GBS:  +GBS bacteruria  1 hr Glucola normal Genetic screening normal Quad Anatomy US normal  Prenatal Transfer Tool  Maternal Diabetes: No Genetic Screening: Normal Maternal Ultrasounds/Referrals: Normal Fetal Ultrasounds or other Referrals:  None Maternal Substance Abuse:  No Significant Maternal Medications:  None Significant Maternal Lab Results: Lab values include: Group B Strep positive  Past Medical History:  Diagnosis Date  . Chlamydia   . Infection    UTI  . Trichomonosis    hx  . Urinary tract infection   . UTI (urinary tract infection) during pregnancy, second trimester 08/18/2016    Past Surgical History:  Procedure Laterality Date  . CESAREAN SECTION  2010  . CESAREAN SECTION  02/02/2011   Procedure: CESAREAN SECTION;  Surgeon: Kathreen Cosier, MD;  Location: [facility];  Service: Gynecology;  Laterality: N/A;  Repeat  . CESAREAN SECTION N/A 04/03/2013   Procedure: REPEAT CESAREAN SECTION;  Surgeon: Kathreen Cosier, MD;  Location: [facility];  Service: Obstetrics;   Laterality: N/A;  X2    OB History  Gravida Para Term Preterm AB Living  6 4 4  0 1 4  SAB TAB Ectopic Multiple Live Births  1 0 0 0 4    # Outcome Date GA Lbr Len/2nd Weight Sex Delivery Anes PTL Lv  6 Current           5 Term 2016     CS-LTranv   LIV  4 Term 04/03/13 [redacted]w[redacted]d  6 lb 2.4 oz (2.79 kg) M CS-Vac Spinal  LIV  3 SAB 2014          2 Term 02/02/11 [redacted]w[redacted]d  7 lb 3.3 oz (3.27 kg) F CS-Vac Spinal  LIV     Birth Comments: scheduled repeat  1 Term 2010 [redacted]w[redacted]d  9 lb 2 oz (4.139 kg) M CS-LTranv   LIV     Birth Comments: transverse lie      Social History   Social History  . Marital status: Single    Spouse name: N/A  . Number of children: N/A  . Years of education: N/A   Social History Main Topics  . Smoking status: Never Smoker  . Smokeless tobacco: Never Used  . Alcohol use No  . Drug use: No  . Sexual activity: Yes    Birth control/ protection: None     Comment: pregnant   Other Topics Concern  . Not on file   Social History Narrative  . No narrative on file    Family History  Problem Relation Age of Onset  . Diabetes Paternal Grandmother   . Hypertension Paternal Grandmother   . Cancer  Paternal Grandmother        breast  . Cancer Paternal Grandfather        brain  . Hypertension Father   . Diabetes Father   . Heart disease Father   . Hypertension Mother   . Diabetes Mother   . Hearing loss Neg Hx     No prescriptions prior to admission.    No Known Allergies  Review of Systems: Negative except for what is mentioned in HPI.  Physical Exam: BP 119/62   Pulse 85   Temp 98.8 F (37.1 C) (Oral)   Resp 18   Ht 4\' 11"  (1.499 m)   Wt 135 lb (61.2 kg)   LMP 02/05/2016   SpO2 99%   BMI 27.27 kg/m  FHR by Doppler appreciated CONSTITUTIONAL: Well-developed, well-nourished female in no acute distress.  HENT:  Normocephalic, atraumatic, External right and left ear normal. Oropharynx is clear and moist EYES: Conjunctivae and EOM are normal. Pupils  are equal, round, and reactive to light. No scleral icterus.  NECK: Normal range of motion, supple, no masses SKIN: Skin is warm and dry. No rash noted. Not diaphoretic. No erythema. No pallor. NEUROLGIC: Alert and oriented to person, place, and time. Normal reflexes, muscle tone coordination. No cranial nerve deficit noted. PSYCHIATRIC: Normal mood and affect. Normal behavior. Normal judgment and thought content. CARDIOVASCULAR: Normal heart rate noted, regular rhythm RESPIRATORY: Effort and breath sounds normal, no problems with respiration noted ABDOMEN: Soft, nontender, nondistended, gravid. Well-healed Pfannenstiel incision. PELVIC: Deferred MUSCULOSKELETAL: Normal range of motion. No edema and no tenderness. 2+ distal pulses.   Pertinent Labs/Studies:   Results for orders placed or performed during the hospital encounter of 11/04/16 (from the past 72 hour(s))  CBC     Status: Abnormal   Collection Time: 11/04/16  3:21 PM  Result Value Ref Range   WBC 7.1 4.0 - 10.5 K/uL   RBC 3.74 (L) 3.87 - 5.11 MIL/uL   Hemoglobin 9.3 (L) 12.0 - 15.0 g/dL   HCT 16.129.9 (L) 09.636.0 - 04.546.0 %   MCV 79.9 78.0 - 100.0 fL   MCH 24.9 (L) 26.0 - 34.0 pg   MCHC 31.1 30.0 - 36.0 g/dL   RDW 40.915.7 (H) 81.111.5 - 91.415.5 %   Platelets 249 150 - 400 K/uL  RPR     Status: None   Collection Time: 11/04/16  3:21 PM  Result Value Ref Range   RPR Ser Ql Non Reactive Non Reactive    Comment: (NOTE) Performed At: [facility] [address] [place], [place] [id number] Mila HomerHancock William F MD [phone]   Type and screen     Status: None   Collection Time: 11/04/16  3:21 PM  Result Value Ref Range   ABO/RH(D) B POS    Antibody Screen NEG    Sample Expiration 11/07/2016     Assessment and Plan :Rose SableDesirae A [name] is a 26 y.o. [id number] at 2977w1d being admitted for scheduled cesarean section. The risks of cesarean section discussed with the patient included but were not limited to: bleeding which may require transfusion  or reoperation; infection which may require antibiotics; injury to bowel, bladder, ureters or other surrounding organs; injury to the fetus; need for additional procedures including hysterectomy in the event of a life-threatening hemorrhage; placental abnormalities wth subsequent pregnancies, incisional problems, thromboembolic phenomenon and other postoperative/anesthesia complications. The patient concurred with the proposed plan, giving informed written consent for the procedure. Patient has been NPO since last night she will remain  NPO for procedure. Anesthesia and OR aware. Preoperative prophylactic antibiotics and SCDs ordered on call to the OR. To OR when ready.   Caryl AdaJazma Euell Schiff, DO OB Fellow Faculty Practice, [facility] [place] 11/05/2016, 11:38 AM

## 2016-11-05 NOTE — Anesthesia Preprocedure Evaluation (Signed)
Anesthesia Evaluation  Patient identified by MRN, date of birth, ID band Patient awake    Reviewed: Allergy & Precautions, H&P , Patient's Chart, lab work & pertinent test results, reviewed documented beta blocker date and time   Airway Mallampati: II  TM Distance: >3 FB Neck ROM: full    Dental no notable dental hx.    Pulmonary    Pulmonary exam normal breath sounds clear to auscultation       Cardiovascular  Rhythm:regular Rate:Normal     Neuro/Psych    GI/Hepatic   Endo/Other    Renal/GU      Musculoskeletal   Abdominal   Peds  Hematology  (+) anemia ,   Anesthesia Other Findings   Reproductive/Obstetrics                             Anesthesia Physical Anesthesia Plan  ASA: II  Anesthesia Plan: Spinal   Post-op Pain Management:    Induction:   PONV Risk Score and Plan:   Airway Management Planned:   Additional Equipment:   Intra-op Plan:   Post-operative Plan:   Informed Consent: I have reviewed the patients History and Physical, chart, labs and discussed the procedure including the risks, benefits and alternatives for the proposed anesthesia with the patient or authorized representative who has indicated his/her understanding and acceptance.   Dental Advisory Given  Plan Discussed with: CRNA and Surgeon  Anesthesia Plan Comments: (  )        Anesthesia Quick Evaluation  

## 2016-11-05 NOTE — Addendum Note (Signed)
Addendum  created 11/05/16 2126 by Cecile Hearingurk, Stephen Edward, MD   Order list changed, Order sets accessed

## 2016-11-06 ENCOUNTER — Encounter (HOSPITAL_COMMUNITY): Payer: Self-pay | Admitting: Obstetrics and Gynecology

## 2016-11-06 LAB — CBC
HCT: 25.1 % — ABNORMAL LOW (ref 36.0–46.0)
Hemoglobin: 8 g/dL — ABNORMAL LOW (ref 12.0–15.0)
MCH: 25 pg — ABNORMAL LOW (ref 26.0–34.0)
MCHC: 31.9 g/dL (ref 30.0–36.0)
MCV: 78.4 fL (ref 78.0–100.0)
PLATELETS: 211 10*3/uL (ref 150–400)
RBC: 3.2 MIL/uL — AB (ref 3.87–5.11)
RDW: 15.5 % (ref 11.5–15.5)
WBC: 10.2 10*3/uL (ref 4.0–10.5)

## 2016-11-06 MED ORDER — FERROUS FUMARATE 324 (106 FE) MG PO TABS
1.0000 | ORAL_TABLET | Freq: Two times a day (BID) | ORAL | Status: DC
  Administered 2016-11-06 – 2016-11-08 (×5): 106 mg via ORAL
  Filled 2016-11-06 (×7): qty 1

## 2016-11-06 NOTE — Lactation Note (Signed)
This note was copied from a baby's chart. Lactation Consultation Note  Patient Name: Rose [name] Today's Date: 11/06/2016 Reason for consult: Initial assessment   With this experienced breast feeding mom and term baby. Mom was having trouble having her baby maintain his latch. Mom agreed to try cross cradle hold to latch, and then switch to cradle. The baby latched deeper and stayed latched this way. Basic breast feeding teaching reviewed with mom, and lactation services also reviewed briefly. Mom knows to call for questions/conerns.    Maternal Data Formula Feeding for Exclusion: No Has patient been taught Hand Expression?: No (mom was able to demonstrate without being taught) Does the patient have breastfeeding experience prior to this delivery?: Yes  Feeding Feeding Type: Breast Fed  LATCH Score/Interventions Latch: Repeated attempts needed to sustain latch, nipple held in mouth throughout feeding, stimulation needed to elicit sucking reflex. (baby latched easier once om brought the baby to the breast, ahd used cross cradle hold to start, and then switched to cradle) Intervention(s): Adjust position;Assist with latch  Audible Swallowing: A few with stimulation     Comfort (Breast/Nipple): Soft / non-tender     Hold (Positioning): Assistance needed to correctly position infant at breast and maintain latch. Intervention(s): Breastfeeding basics reviewed;Support Pillows;Position options;Skin to skin     Lactation Tools Discussed/Used     Consult Status Consult Status: Follow-up Date: 11/07/16 Follow-up type: In-patient    Alfred LevinsLee, Azavier Creson Anne 11/06/2016, 1:59 PM

## 2016-11-06 NOTE — Progress Notes (Signed)
Subjective: Postpartum Day #1: Cesarean Delivery Patient reports tolerating PO and + flatus.  Breastfeeding going well; considering Depo vs IUD for pp contraception. Denies dizziness with ambulation. Foley removed recently- has not had a chance to void yet.  Objective: Vital signs in last 24 hours: Temp:  [97.5 F (36.4 C)-98.8 F (37.1 C)] 97.9 F (36.6 C) (07/28 0640) Pulse Rate:  [64-113] 64 (07/28 0640) Resp:  [14-25] 18 (07/28 0640) BP: (94-124)/(28-92) 101/51 (07/28 0640) SpO2:  [94 %-100 %] 100 % (07/28 0640) Weight:  [61.2 kg (135 lb)] 61.2 kg (135 lb) (07/27 1326)  Physical Exam:  General: alert, cooperative and no distress Lochia: appropriate Uterine Fundus: firm Incision: PICO intact, sm areas marked, unchanged DVT Evaluation: No evidence of DVT seen on physical exam.   Recent Labs  11/04/16 1521 11/06/16 0508  HGB 9.3* 8.0*  HCT 29.9* 25.1*    Assessment/Plan: Status post Cesarean section. Doing well postoperatively.  Continue current care. Anticipate d/c in AM 11/07/16. Iron started.  Cam HaiSHAW, Candies Palm CNM 11/06/2016, 7:45 AM

## 2016-11-06 NOTE — Anesthesia Postprocedure Evaluation (Signed)
Anesthesia Post Note  Patient: Lameka A Rothman  Procedure(s) Performed: Procedure(s) (LRB): CESAREAN SECTION (N/A)     Patient location during evaluation: Mother Baby Anesthesia Type: Spinal Level of consciousness: awake and alert and oriented Pain management: pain level controlled Vital Signs Assessment: post-procedure vital signs reviewed and stable Respiratory status: spontaneous breathing and nonlabored ventilation Cardiovascular status: stable Postop Assessment: no headache, no backache, patient able to bend at knees, no signs of nausea or vomiting, adequate PO intake and spinal receding Anesthetic complications: no    Last Vitals:  Vitals:   11/06/16 0335 11/06/16 0640  BP: (!) 114/57 (!) 101/51  Pulse: 89 64  Resp: 16 18  Temp: 36.7 C 36.6 C    Last Pain:  Vitals:   11/06/16 0641  TempSrc:   PainSc: 0-No pain   Pain Goal: Patients Stated Pain Goal: 4 (11/05/16 1326)               Laban EmperorMalinova,Sudiksha Victor Hristova

## 2016-11-06 NOTE — Addendum Note (Signed)
Addendum  created 11/06/16 0756 by Elgie CongoMalinova, Chaston Bradburn H, CRNA   Sign clinical note

## 2016-11-07 ENCOUNTER — Encounter (HOSPITAL_COMMUNITY): Payer: Self-pay | Admitting: *Deleted

## 2016-11-07 NOTE — Progress Notes (Signed)
POSTPARTUM PROGRESS NOTE  Post OP day 2 Subjective:  Rose [name] is a 26 y.o. [id number] 5541w1d s/p c section.  No acute events overnight.  Pt denies problems with ambulating, voiding or po intake.  She denies nausea or vomiting.  Pain is well controlled.  She has had flatus. She has not had bowel movement.  Lochia Small.   Objective: Blood pressure (!) 110/53, pulse 85, temperature 98.6 F (37 C), temperature source Oral, resp. rate 18, height 4\' 11"  (1.499 m), weight 61.2 kg (135 lb), last menstrual period 02/05/2016, SpO2 95 %, unknown if currently breastfeeding.  Physical Exam:  General: alert, cooperative and no distress Lochia:normal flow Chest: CTAB Heart: RRR no m/r/g Abdomen: +BS, soft, nontender,  Uterine Fundus: firm DVT Evaluation: No calf swelling or tenderness Extremities:  no edema Incision: some blood dried on central portion of the bandage    Recent Labs  11/04/16 1521 11/06/16 0508  HGB 9.3* 8.0*  HCT 29.9* 25.1*    Assessment/Plan:  ASSESSMENT: Rose [name] is a 26 y.o. [id number] 5841w1d s/p c section  Plan for discharge tomorrow, as she does not know about her transportation yet   LOS: 2 days   Sullivan LoneBrannon L Tramon Crescenzo, Medical Student

## 2016-11-07 NOTE — Progress Notes (Signed)
Subjective: Postpartum Day 2: Cesarean Delivery Patient reports feeling well. She denies chest pain, SOB, lightheadedness/dizziness. She is ambulating, voiding and tolerating po  Objective: Vital signs in last 24 hours: Temp:  [97.4 F (36.3 C)-99.5 F (37.5 C)] 98.6 F (37 C) (07/29 0521) Pulse Rate:  [78-104] 85 (07/29 0521) Resp:  [18-20] 18 (07/29 0521) BP: (99-110)/(51-57) 110/53 (07/29 0521) SpO2:  [95 %-98 %] 95 % (07/29 0015)  Physical Exam:  General: alert, cooperative and no distress Lochia: appropriate Uterine Fundus: firm Incision: PICO dressing clean and dry DVT Evaluation: No evidence of DVT seen on physical exam. Negative Homan's sign.   Recent Labs  11/04/16 1521 11/06/16 0508  HGB 9.3* 8.0*  HCT 29.9* 25.1*    Assessment/Plan: Status post Cesarean section. Doing well postoperatively.  Continue current care Plan for discharge tomorrow.  Rose [name] 11/07/2016, 7:30 AM

## 2016-11-07 NOTE — Discharge Instructions (Signed)
Cesarean Delivery °Cesarean birth, or cesarean delivery, is the surgical delivery of a baby through an incision in the abdomen and the uterus. This may be referred to as a C-section. This procedure may be scheduled ahead of time, or it may be done in an emergency situation. °Tell a health care provider about: °· Any allergies you have. °· All medicines you are taking, including vitamins, herbs, eye drops, creams, and over-the-counter medicines. °· Any problems you or family members have had with anesthetic medicines. °· Any blood disorders you have. °· Any surgeries you have had. °· Any medical conditions you have. °· Whether you or any members of your family have a history of deep vein thrombosis (DVT) or pulmonary embolism (PE). °What are the risks? °Generally, this is a safe procedure. However, problems may occur, including: °· Infection. °· Bleeding. °· Allergic reactions to medicines. °· Damage to other structures or organs. °· Blood clots. °· Injury to your baby. ° °What happens before the procedure? °· Follow instructions from your health care provider about eating or drinking restrictions. °· Follow instructions from your health care provider about bathing before your procedure to help reduce your risk of infection. °· If you know that you are going to have a cesarean delivery, do not shave your pubic area. Shaving before the procedure may increase your risk of infection. °· Ask your health care provider about: °? Changing or stopping your regular medicines. This is especially important if you are taking diabetes medicines or blood thinners. °? Your pain management plan. This is especially important if you plan to breastfeed your baby. °? How long you will be in the hospital after the procedure. °? Any concerns you may have about receiving blood products if you need them during the procedure. °? Cord blood banking, if you plan to collect your baby’s umbilical cord blood. °· You may also want to ask your  health care provider: °? Whether you will be able to hold or breastfeed your baby while you are still in the operating room. °? Whether your baby can stay with you immediately after the procedure and during your recovery. °? Whether a family member or a person of your choice can go with you into the operating room and stay with you during the procedure, immediately after the procedure, and during your recovery. °· Plan to have someone drive you home when you are discharged from the hospital. °What happens during the procedure? °· Fetal monitors will be placed on your abdomen to monitor your heart rate and your baby's heart rate. °· Depending on the reason for your cesarean delivery, you may have a physical exam or additional testing, such as an ultrasound. °· An IV tube will be inserted into one of your veins. °· You may have your blood or urine tested. °· You will be given antibiotic medicine to help prevent infection. °· You may be given a special warming gown to wear to keep your temperature stable. °· Hair may be removed from your pubic area. °· The skin of your pubic area and lower abdomen will be cleaned with a germ-killing solution (antiseptic). °· A catheter may be inserted into your bladder through your urethra. This drains your urine during the procedure. °· You may be given one or more of the following: °? A medicine to numb the area (local anesthetic). °? A medicine to make you fall asleep (general anesthetic). °? A medicine (regional anesthetic) that is injected into your back or through a small   thin tube placed in your back (spinal anesthetic or epidural anesthetic). This numbs everything below the injection site and allows you to stay awake during your procedure. If this makes you feel nauseous, tell your health care provider. Medicines will be available to help reduce any nausea you may feel. °· An incision will be made in your abdomen, and then in your uterus. °· If you are awake during your  procedure, you may feel tugging and pulling in your abdomen, but you should not feel pain. If you feel pain, tell your health care provider immediately. °· Your baby will be removed from your uterus. You may feel more pressure or pushing while this happens. °· Immediately after birth, your baby will be dried and kept warm. You may be able to hold and breastfeed your baby. The umbilical cord may be clamped and cut during this time. °· Your placenta will be removed from your uterus. °· Your incisions will be closed with stitches (sutures). Staples, skin glue, or adhesive strips may also be applied to the incision in your abdomen. °· Bandages (dressings) will be placed over the incision in your abdomen. °The procedure may vary among health care providers and hospitals. °What happens after the procedure? °· Your blood pressure, heart rate, breathing rate, and blood oxygen level will be monitored often until the medicines you were given have worn off. °· You may continue to receive fluids and medicines through an IV tube. °· You will have some pain. Medicines will be available to help control your pain. °· To help prevent blood clots: °? You may be given medicines. °? You may have to wear compression stockings or devices. °? You will be encouraged to walk around when you are able. °· Hospital staff will encourage and support bonding with your baby. Your hospital may allow you and your baby to stay in the same room (rooming in) during your hospital stay to encourage successful breastfeeding. °· You may be encouraged to cough and breathe deeply often. This helps to prevent lung problems. °· If you have a catheter draining your urine, it will be removed as soon as possible after your procedure. °This information is not intended to replace advice given to you by your health care provider. Make sure you discuss any questions you have with your health care provider. °Document Released: 03/29/2005 Document Revised: 09/04/2015  Document Reviewed: 01/07/2015 °Elsevier Interactive Patient Education © 2017 [employer] ° °

## 2016-11-08 MED ORDER — IBUPROFEN 600 MG PO TABS: 600.0000 mg | ORAL_TABLET | Freq: Four times a day (QID) | ORAL | 0 refills | Status: DC | PRN

## 2016-11-08 MED ORDER — FERROUS SULFATE 325 (65 FE) MG PO TABS: 325.0000 mg | ORAL_TABLET | Freq: Three times a day (TID) | ORAL | 3 refills | Status: DC

## 2016-11-08 MED ORDER — OXYCODONE HCL 5 MG PO TABS
5.0000 mg | ORAL_TABLET | ORAL | 0 refills | Status: DC | PRN
Start: 2016-11-08 — End: 2017-08-26

## 2016-11-08 NOTE — Discharge Summary (Signed)
OB Discharge Summary     Patient Name: Rose [name] DOB: 08-31-1990 MRN: [id number]  Date of admission: 11/05/2016 Delivering MD:  BingPICKENS, CHARLIE   Date of discharge: 11/08/2016  Admitting diagnosis: Repeat Cesarean Section  Intrauterine pregnancy: 2355w1d     Secondary diagnosis:  Active Problems:   History of cesarean delivery   Status post repeat low transverse cesarean section Prev C/S x 4  Additional problems: GBS pos     Discharge diagnosis: Term Pregnancy Delivered and Anemia                                                                                                Post partum procedures:none  Augmentation: none  Complications: None  Hospital course:  Sceduled C/S   26 y.o. yo [id number] at 4155w1d was admitted to the hospital 11/05/2016 for scheduled cesarean section with the following indication:Elective Repeat.  Membrane Rupture Time/Date: 4:58 PM ,11/05/2016   Patient delivered a Viable infant.11/05/2016  Details of operation can be found in separate operative note.  Pateint had an uncomplicated postpartum course.  She is ambulating, tolerating a regular diet, passing flatus, and urinating well. Patient is discharged home in stable condition on  11/08/16         Physical exam  Vitals:   11/07/16 0015 11/07/16 0521 11/07/16 1820 11/08/16 0510  BP: (!) 108/54 (!) 110/53 (!) 113/59 119/68  Pulse: (!) 104 85 77 71  Resp: 18 18 18 18   Temp: 98.6 F (37 C) 98.6 F (37 C) 98.3 F (36.8 C) 98.3 F (36.8 C)  TempSrc: Oral Oral Oral Oral  SpO2: 95%   99%  Weight:      Height:       General: alert and cooperative Lochia: appropriate Uterine Fundus: firm Incision: PICO intact; marked and unchanged DVT Evaluation: No evidence of DVT seen on physical exam. Labs: Lab Results  Component Value Date   WBC 10.2 11/06/2016   HGB 8.0 (L) 11/06/2016   HCT 25.1 (L) 11/06/2016   MCV 78.4 11/06/2016   PLT 211 11/06/2016   CMP Latest Ref Rng & Units 11/14/2012  Glucose  70 - 99 mg/dL 85  BUN 6 - 23 mg/dL 9  Creatinine 1.470.50 - 8.291.10 mg/dL 5.620.71  Sodium 130135 - 865145 mEq/L 135  Potassium 3.5 - 5.1 mEq/L 3.9  Chloride 96 - 112 mEq/L 103  CO2 19 - 32 mEq/L 23  Calcium 8.4 - 10.5 mg/dL 9.5  Total Protein 6.0 - 8.3 g/dL 6.2  Total Bilirubin 0.3 - 1.2 mg/dL 7.8(I0.2(L)  Alkaline Phos 39 - 117 U/L 53  AST 0 - 37 U/L 11  ALT 0 - 35 U/L 5    Discharge instruction: per After Visit Summary and "Baby and Me Booklet".  After visit meds:  Allergies as of 11/08/2016   No Known Allergies     Medication List    STOP taking these medications   BC FAST PAIN RELIEF 845-65 MG Pack Generic drug:  Aspirin-Caffeine   COMFORT FIT MATERNITY SUPP MED Misc   fluticasone 50 MCG/ACT nasal spray Commonly known as:  FLONASE  ALLERGY RELIEF     TAKE these medications   ferrous sulfate 325 (65 FE) MG tablet Take 1 tablet (325 mg total) by mouth 3 (three) times daily with meals.   ibuprofen 600 MG tablet Commonly known as:  ADVIL,MOTRIN Take 1 tablet (600 mg total) by mouth every 6 (six) hours as needed.   oxyCODONE 5 MG immediate release tablet Commonly known as:  Oxy IR/ROXICODONE Take 1 tablet (5 mg total) by mouth every 4 (four) hours as needed (pain scale 4-7).   Prenatal Vitamins 0.8 MG tablet Take 1 tablet by mouth daily.       Diet: routine diet  Activity: Advance as tolerated. Pelvic rest for 6 weeks.   Outpatient follow up:on POD#5 (Wed) for PICO and staple removal Follow up Appt:No future appointments. Follow up Visit:No Follow-up on file.  Postpartum contraception: Depo Provera and IUD Mirena- undecided  Newborn Data: Live born female  Birth Weight: 6 lb 12.3 oz (3070 g) APGAR: 8, 8  Baby Feeding: Breast Disposition:home with mother   11/08/2016 Cam HaiSHAW, KIMBERLY, CNM  7:25 AM

## 2016-11-08 NOTE — Lactation Note (Signed)
This note was copied from a baby's chart. Lactation Consultation Note: Mother just breastfed infant and then post pumped . She obtained approx 80 ml of colostrum. Mothers breast are full . She plans to breastfeed infant again to soften breast. Mother is active with WIC . She doesn't have an electric pump at home. Mother was given a harmony manual pump with instructions . Mother uses a #24 flange. Mother complaints of slight discomfort when pumping . Advised to turn the pump setting down. Mother to get coconut oil from staff nurse. Mother advised to continue to cue base feed infant and at least 8-12 times in 24 hours. Mother plans to phone [facility] today. Discussed treatment to prevent engorgement and over supply. Mother receptive to all teaching. She is aware of available LC services and community support.   Patient Name: Boy Louretta ShortenDesirae Arriola KGMWN'UToday's Date: 11/08/2016 Reason for consult: Follow-up assessment   Maternal Data    Feeding Feeding Type: Breast Fed Length of feed: 15 min  LATCH Score                   Interventions    Lactation Tools Discussed/Used     Consult Status Consult Status: Complete    Michel BickersKendrick, Dwaine Pringle McCoy 11/08/2016, 9:25 AM

## 2016-11-11 ENCOUNTER — Ambulatory Visit: Payer: Medicaid Other | Admitting: *Deleted

## 2016-11-11 VITALS — BP 137/76 | HR 54

## 2016-11-11 DIAGNOSIS — Z4802 Encounter for removal of sutures: Secondary | ICD-10-CM

## 2016-11-11 NOTE — Progress Notes (Signed)
Dressing with pico removed. Old dried blood/ discharge noted on dressing. Incision clean, dry, intact with staples.  Staples removed , steristrips applied. Instructed patient to remove steristrips after 7 days. Come to mau or call office if day time office open if incision opens, or signs of infection, etc. Patient to make post partum appt. today

## 2016-11-15 ENCOUNTER — Telehealth: Payer: Self-pay | Admitting: Family Medicine

## 2016-11-15 NOTE — Telephone Encounter (Signed)
Patient called and request a call back from the nurse.

## 2016-11-17 ENCOUNTER — Ambulatory Visit: Payer: Medicaid Other

## 2016-11-17 NOTE — Telephone Encounter (Signed)
I called Rose [name] back and she reports her incision is fine but she is having a yellow vaginal discharge with a bad odor. She denies intercourse since delivery and denies she has had any vaginal infections except yeast.  I advised her that vaginal discharge can be a symptom of several different things and I advised her  to come in for a self swab for wet prep and then we will treat if positive. She voices understanding and agreed to appt today at 1pm.

## 2016-11-19 ENCOUNTER — Encounter: Payer: Self-pay | Admitting: General Practice

## 2016-11-23 ENCOUNTER — Ambulatory Visit (INDEPENDENT_AMBULATORY_CARE_PROVIDER_SITE_OTHER): Payer: Medicaid Other | Admitting: *Deleted

## 2016-11-23 ENCOUNTER — Other Ambulatory Visit (HOSPITAL_COMMUNITY)
Admission: RE | Admit: 2016-11-23 | Discharge: 2016-11-23 | Disposition: A | Discharge status: Home / Self Care | Payer: Medicaid Other | Source: Ambulatory Visit | Attending: Family Medicine | Admitting: Family Medicine

## 2016-11-23 DIAGNOSIS — Z113 Encounter for screening for infections with a predominantly sexual mode of transmission: Secondary | ICD-10-CM | POA: Diagnosis not present

## 2016-11-23 DIAGNOSIS — N898 Other specified noninflammatory disorders of vagina: Secondary | ICD-10-CM | POA: Insufficient documentation

## 2016-11-23 DIAGNOSIS — L298 Other pruritus: Secondary | ICD-10-CM | POA: Insufficient documentation

## 2016-11-23 NOTE — Progress Notes (Signed)
Pt presented to office with report of vaginal itching and yellow d/c since 7/30. She had C/S on 7/27. Per chart review, pt spoke with Raynald BlendLinda Zeyfang RN via telephone regarding these sx on 8/8. She was advised to come to office and did not keep appt that Miron Marxen. Pt denies sexual intercourse since birth of baby but stated she would like testing for STI in addition to wet prep today. Self swab performed.  Pt will be called with results.

## 2016-11-25 LAB — CERVICOVAGINAL ANCILLARY ONLY
BACTERIAL VAGINITIS: POSITIVE — AB
Candida vaginitis: NEGATIVE
Chlamydia: NEGATIVE
NEISSERIA GONORRHEA: NEGATIVE
Trichomonas: POSITIVE — AB

## 2016-11-26 ENCOUNTER — Telehealth: Payer: Self-pay

## 2016-11-26 ENCOUNTER — Other Ambulatory Visit: Payer: Self-pay | Admitting: Family Medicine

## 2016-11-26 MED ORDER — METRONIDAZOLE 500 MG PO TABS: 500.0000 mg | ORAL_TABLET | Freq: Two times a day (BID) | ORAL | 0 refills | Status: DC

## 2016-11-26 NOTE — Telephone Encounter (Signed)
Call patient inform her of BV and trich and the need to be treated. Patient voice understanding at this time.

## 2016-11-26 NOTE — Telephone Encounter (Signed)
-----   Message from Levie Heritage, DO sent at 11/26/2016 10:28 AM EDT ----- + BV and trich. Flagyl faxed to pharmacy. Pt would like to be called with results

## 2016-12-06 ENCOUNTER — Telehealth: Payer: Self-pay | Admitting: General Practice

## 2016-12-06 DIAGNOSIS — G8918 Other acute postprocedural pain: Secondary | ICD-10-CM

## 2016-12-06 MED ORDER — IBUPROFEN 600 MG PO TABS: 600.0000 mg | ORAL_TABLET | Freq: Four times a day (QID) | ORAL | 0 refills | Status: DC | PRN

## 2016-12-06 NOTE — Telephone Encounter (Signed)
Patient called and left message on nurse line listing only her name and number. Called patient and she states she is still hurting from her surgery and is out of medicine. Patient states her pharmacy only gave her 30 pills because of a new policy and the Rx was for 50 originally and she cannot go pick up the remaining 20. Told patient I will call the pharmacy for clarification. Called [facility] and they state they have a new policy for acute cases for narcotic prescriptions where they will only dispense a 5 day supply and the patient forfeits the remaining supply. They state this is a Journalist, newspaper and the patient is informed before the drug is dispensed. They state if the patient wants anymore they have to have a new prescription. Called patient back and discussed with her. Asked patient if she had any ibuprofen remaining and she states no. Told patient we will send in a refill for her as that should be all she needs for pain relief at this point. Patient verbalized understanding and had no questions

## 2016-12-14 ENCOUNTER — Telehealth: Payer: Self-pay | Admitting: General Practice

## 2016-12-14 ENCOUNTER — Ambulatory Visit: Payer: Medicaid Other | Admitting: Medical

## 2016-12-14 NOTE — Telephone Encounter (Signed)
Patient missed Postpartum visit today.  Called to reschedule patient, but no answer. Rescheduled patient for 12/29/16 at 8:40am.  Letter will be mailed.

## 2016-12-29 ENCOUNTER — Encounter: Payer: Self-pay | Admitting: General Practice

## 2016-12-29 ENCOUNTER — Ambulatory Visit: Payer: Medicaid Other | Admitting: Medical

## 2017-08-09 ENCOUNTER — Other Ambulatory Visit: Payer: Self-pay

## 2017-08-09 ENCOUNTER — Emergency Department (HOSPITAL_COMMUNITY)
Admission: EM | Admit: 2017-08-09 | Discharge: 2017-08-09 | Disposition: A | Discharge status: Home / Self Care | Payer: Medicaid Other | Attending: Emergency Medicine | Admitting: Emergency Medicine

## 2017-08-09 DIAGNOSIS — K029 Dental caries, unspecified: Secondary | ICD-10-CM | POA: Insufficient documentation

## 2017-08-09 DIAGNOSIS — K0889 Other specified disorders of teeth and supporting structures: Secondary | ICD-10-CM | POA: Diagnosis present

## 2017-08-09 MED ORDER — PENICILLIN V POTASSIUM 500 MG PO TABS: 500.0000 mg | ORAL_TABLET | Freq: Four times a day (QID) | ORAL | 0 refills | Status: AC

## 2017-08-09 NOTE — Discharge Instructions (Addendum)
Please read instructions below. Take the antibiotic, Penicillin, 4 times per day until they are gone. You can take Naproxen or ibuprofen  as needed for pain. Schedule an appointment with a dentist, using the dental resource guide attached. Return to the ER for difficulty swallowing or breathing, fever, or new or worsening symptoms.

## 2017-08-09 NOTE — ED Provider Notes (Signed)
[facility] EMERGENCY DEPARTMENT Provider Note   CSN: [id number] Arrival date & time: 08/09/17  1718     History   Chief Complaint Chief Complaint  Patient presents with  . Dental Pain    HPI Rose [name] is a 27 y.o. female presenting to the ED complaining of dental pain located left side of mouth. Pt states pain began a few months ago, described as throbbing and radiating to left ear. Has taken OTC pain reliever for symptoms. Denies difficulty swallowing or breathing, fever, chills, N/V, or other symptoms today.  Patient states she was evaluated by dentist a couple of months ago, who said she needed multiple teeth pulled.  She states she is unable to have the procedures done because the dentist would not accept her Medicaid.   The history is provided by the patient.    Past Medical History:  Diagnosis Date  . Chlamydia   . Infection    UTI  . Trichomonosis    hx  . Urinary tract infection   . UTI (urinary tract infection) during pregnancy, second trimester 08/18/2016    Patient Active Problem List   Diagnosis Date Noted  . History of cesarean delivery 11/05/2016  . Status post repeat low transverse cesarean section 11/05/2016  . Supervision of normal pregnancy, antepartum 06/17/2016    Past Surgical History:  Procedure Laterality Date  . CESAREAN SECTION  2010  . CESAREAN SECTION  02/02/2011   Procedure: CESAREAN SECTION;  Surgeon: Kathreen Cosier, MD;  Location: [facility] ORS;  Service: Gynecology;  Laterality: N/A;  Repeat  . CESAREAN SECTION N/A 04/03/2013   Procedure: REPEAT CESAREAN SECTION;  Surgeon: Kathreen Cosier, MD;  Location: [facility] ORS;  Service: Obstetrics;  Laterality: N/A;  X2  . CESAREAN SECTION N/A 11/05/2016   Procedure: CESAREAN SECTION;  Surgeon: Southside Bing, MD;  Location: [facility] BIRTHING SUITES;  Service: Obstetrics;  Laterality: N/A;     OB History    Gravida  6   Para  5   Term  5   Preterm  0   AB  1   Living  5       SAB  1   TAB  0   Ectopic  0   Multiple  0   Live Births  5            Home Medications    Prior to Admission medications   Medication Sig Start Date End Date Taking? Authorizing Provider  ferrous sulfate 325 (65 FE) MG tablet Take 1 tablet (325 mg total) by mouth 3 (three) times daily with meals. 11/08/16  Yes Arabella Merles, CNM  ibuprofen (ADVIL,MOTRIN) 600 MG tablet Take 1 tablet (600 mg total) by mouth every 6 (six) hours as needed. 12/06/16  Yes Elenora Fender, Walidah N, CNM  metroNIDAZOLE (FLAGYL) 500 MG tablet Take 1 tablet (500 mg total) by mouth 2 (two) times daily. Patient not taking: Reported on 08/09/2017 11/26/16   Levie Heritage, DO  oxyCODONE (OXY IR/ROXICODONE) 5 MG immediate release tablet Take 1 tablet (5 mg total) by mouth every 4 (four) hours as needed (pain scale 4-7). Patient not taking: Reported on 11/11/2016 11/08/16   Cam Hai D, CNM  penicillin v potassium (VEETID) 500 MG tablet Take 1 tablet (500 mg total) by mouth 4 (four) times daily for 7 days. 08/09/17 08/16/17  Yariel Ferraris, [place] N, PA-C  Prenatal Multivit-Min-Fe-FA (PRENATAL VITAMINS) 0.8 MG tablet Take 1 tablet by mouth daily. Patient not taking:  Reported on 08/09/2017 06/17/16   Adam Phenix, MD    Family History Family History  Problem Relation Age of Onset  . Diabetes Paternal Grandmother   . Hypertension Paternal Grandmother   . Cancer Paternal Grandmother        breast  . Cancer Paternal Grandfather        brain  . Hypertension Father   . Diabetes Father   . Heart disease Father   . Hypertension Mother   . Diabetes Mother   . Hearing loss Neg Hx     Social History Social History   Tobacco Use  . Smoking status: Never Smoker  . Smokeless tobacco: Never Used  Substance Use Topics  . Alcohol use: No  . Drug use: No     Allergies   Patient has no known allergies.   Review of Systems Review of Systems  Constitutional: Negative for fever.  HENT: Positive for dental  problem. Negative for facial swelling and trouble swallowing.   Respiratory: Negative for stridor.      Physical Exam Updated Vital Signs BP 120/65 (BP Location: Left Arm)   Pulse 83   Temp 98.6 F (37 C) (Oral)   Resp 18   Ht  (1.499 m)   Wt 49.9 kg (110 lb)   SpO2 98%   BMI 22.22 kg/m   Physical Exam  Constitutional: She appears well-developed and well-nourished. No distress.  HENT:  Head: Normocephalic and atraumatic.  Right Ear: Tympanic membrane normal.  Left Ear: Tympanic membrane normal.  Poor dentition throughout.  No gingival edema or fluctuance.  No sublingual edema or tenderness.  Oropharynx is clear, tolerating secretions.  Eyes: Conjunctivae are normal.  Neck: Normal range of motion. Neck supple.  Cardiovascular: Normal rate.  Pulmonary/Chest: Effort normal.  Lymphadenopathy:    She has no cervical adenopathy.  Psychiatric: She has a normal mood and affect. Her behavior is normal.  Nursing note and vitals reviewed.    ED Treatments / Results  Labs (all labs ordered are listed, but only abnormal results are displayed) Labs Reviewed - No data to display  EKG None  Radiology No results found.  Procedures Procedures (including critical care time)  Medications Ordered in ED Medications - No data to display   Initial Impression / Assessment and Plan / ED Course  I have reviewed the triage vital signs and the nursing notes.  Pertinent labs & imaging results that were available during my care of the patient were reviewed by me and considered in my medical decision making (see chart for details).    Patient with dental caries.  No gross abscess.  VSS, afebrile, tolerating secretions. Exam unconcerning for peritonsillar abscess, Ludwig's angina or spread of infection.  Will treat with penicillin and pain medicine.  Urged patient to follow-up with dentist. Pt safe for discharge.  Discussed results, findings, treatment and follow up. Patient  advised of return precautions. Patient verbalized understanding and agreed with plan.  Final Clinical Impressions(s) / ED Diagnoses   Final diagnoses:  Pain due to dental caries    ED Discharge Orders        Ordered    penicillin v potassium (VEETID) 500 MG tablet  4 times daily     08/09/17 1757       Novak Stgermaine, [place] N, PA-C 08/09/17 1810    Mesner, Barbara Cower, MD 08/11/17 [phone]

## 2017-08-09 NOTE — ED Triage Notes (Signed)
Per pt the left side of face has been hurting. Pt stated she went to dentist and was told she had several teeth that needed to be pulled.

## 2017-08-16 ENCOUNTER — Encounter: Payer: Self-pay | Admitting: *Deleted

## 2017-08-19 ENCOUNTER — Inpatient Hospital Stay (HOSPITAL_COMMUNITY)
Admission: AD | Admit: 2017-08-19 | Discharge: 2017-08-19 | Disposition: A | Discharge status: Home / Self Care | Payer: Medicaid Other | Source: Ambulatory Visit | Attending: Obstetrics & Gynecology | Admitting: Obstetrics & Gynecology

## 2017-08-19 DIAGNOSIS — R109 Unspecified abdominal pain: Secondary | ICD-10-CM | POA: Insufficient documentation

## 2017-08-19 NOTE — MAU Note (Signed)
Not in lobby

## 2017-08-26 ENCOUNTER — Inpatient Hospital Stay (HOSPITAL_COMMUNITY)
Admission: AD | Admit: 2017-08-26 | Discharge: 2017-08-26 | Disposition: A | Discharge status: Home / Self Care | Payer: Medicaid Other | Source: Ambulatory Visit | Attending: Obstetrics and Gynecology | Admitting: Obstetrics and Gynecology

## 2017-08-26 ENCOUNTER — Other Ambulatory Visit: Payer: Self-pay

## 2017-08-26 ENCOUNTER — Inpatient Hospital Stay (HOSPITAL_COMMUNITY): Payer: Medicaid Other

## 2017-08-26 ENCOUNTER — Encounter (HOSPITAL_COMMUNITY): Payer: Self-pay | Admitting: *Deleted

## 2017-08-26 DIAGNOSIS — O23599 Infection of other part of genital tract in pregnancy, unspecified trimester: Secondary | ICD-10-CM | POA: Insufficient documentation

## 2017-08-26 DIAGNOSIS — B9689 Other specified bacterial agents as the cause of diseases classified elsewhere: Secondary | ICD-10-CM

## 2017-08-26 DIAGNOSIS — Z833 Family history of diabetes mellitus: Secondary | ICD-10-CM | POA: Diagnosis not present

## 2017-08-26 DIAGNOSIS — O209 Hemorrhage in early pregnancy, unspecified: Secondary | ICD-10-CM | POA: Insufficient documentation

## 2017-08-26 DIAGNOSIS — O3680X0 Pregnancy with inconclusive fetal viability, not applicable or unspecified: Secondary | ICD-10-CM

## 2017-08-26 DIAGNOSIS — Z8249 Family history of ischemic heart disease and other diseases of the circulatory system: Secondary | ICD-10-CM | POA: Insufficient documentation

## 2017-08-26 DIAGNOSIS — Z87440 Personal history of urinary (tract) infections: Secondary | ICD-10-CM | POA: Insufficient documentation

## 2017-08-26 DIAGNOSIS — O4690 Antepartum hemorrhage, unspecified, unspecified trimester: Secondary | ICD-10-CM | POA: Diagnosis not present

## 2017-08-26 DIAGNOSIS — N760 Acute vaginitis: Secondary | ICD-10-CM

## 2017-08-26 DIAGNOSIS — Z3A00 Weeks of gestation of pregnancy not specified: Secondary | ICD-10-CM | POA: Diagnosis not present

## 2017-08-26 DIAGNOSIS — Z6790 Unspecified blood type, Rh positive: Secondary | ICD-10-CM

## 2017-08-26 DIAGNOSIS — N939 Abnormal uterine and vaginal bleeding, unspecified: Secondary | ICD-10-CM | POA: Diagnosis present

## 2017-08-26 DIAGNOSIS — Z8619 Personal history of other infectious and parasitic diseases: Secondary | ICD-10-CM | POA: Diagnosis not present

## 2017-08-26 HISTORY — DX: Depression, unspecified: F32.A

## 2017-08-26 HISTORY — DX: Major depressive disorder, single episode, unspecified: F32.9

## 2017-08-26 LAB — CBC
HCT: 36.1 % (ref 36.0–46.0)
HEMOGLOBIN: 11.5 g/dL — AB (ref 12.0–15.0)
MCH: 27.9 pg (ref 26.0–34.0)
MCHC: 31.9 g/dL (ref 30.0–36.0)
MCV: 87.6 fL (ref 78.0–100.0)
PLATELETS: 365 10*3/uL (ref 150–400)
RBC: 4.12 MIL/uL (ref 3.87–5.11)
RDW: 16.1 % — ABNORMAL HIGH (ref 11.5–15.5)
WBC: 7.6 10*3/uL (ref 4.0–10.5)

## 2017-08-26 LAB — WET PREP, GENITAL
SPERM: NONE SEEN
Trich, Wet Prep: NONE SEEN
YEAST WET PREP: NONE SEEN

## 2017-08-26 LAB — URINALYSIS, ROUTINE W REFLEX MICROSCOPIC
Bilirubin Urine: NEGATIVE
GLUCOSE, UA: NEGATIVE mg/dL
Ketones, ur: NEGATIVE mg/dL
NITRITE: NEGATIVE
PROTEIN: NEGATIVE mg/dL
SPECIFIC GRAVITY, URINE: 1.02 (ref 1.005–1.030)
WBC, UA: >50 WBC/hpf — ABNORMAL HIGH (ref 0–5)
pH: 5 (ref 5.0–8.0)

## 2017-08-26 LAB — POCT PREGNANCY, URINE: PREG TEST UR: POSITIVE — AB

## 2017-08-26 LAB — HCG, QUANTITATIVE, PREGNANCY: hCG, Beta Chain, Quant, S: 1357 m[IU]/mL — ABNORMAL HIGH (ref <5)

## 2017-08-26 MED ORDER — METRONIDAZOLE 500 MG PO TABS: 500.0000 mg | ORAL_TABLET | Freq: Two times a day (BID) | ORAL | 0 refills | Status: AC

## 2017-08-26 MED ORDER — ACETAMINOPHEN 500 MG PO TABS: 1000.0000 mg | ORAL_TABLET | Freq: Four times a day (QID) | ORAL | Status: DC | PRN

## 2017-08-26 NOTE — MAU Note (Signed)
Is preg, unsure how far along she is.  Today, she went to the bathroom and had started bleeding, " a whole bunch of blood and clots came out."  Has been having some cramping /pain in lower abd.  Also wants to know if she can have STD testing.

## 2017-08-26 NOTE — Discharge Instructions (Signed)

## 2017-08-26 NOTE — MAU Provider Note (Addendum)
History     CSN: [id number]  Arrival date and time: 08/26/17 1512   First Provider Initiated Contact with Patient 08/26/17 1605      Chief Complaint  Patient presents with  . Vaginal Bleeding  . Abdominal Pain  . Possible Pregnancy   [id number]  gestation here with VB and LAP. Sx started about 1 hr ago. She reports having a large gush of bright red blood. Some bleeding since. Prior to bleeding she had 3 days of white malodorous vaginal discharge. LAP is bilateral, cramping, and rates 7/10. Has not taken anything for it. No urinary sx. She's requesting STD today d/t new partner. She thinks her LMP was in February and lasted 2 weeks.    OB History    Gravida  7   Para  5   Term  5   Preterm  0   AB  1   Living  5     SAB  1   TAB  0   Ectopic  0   Multiple  0   Live Births  5           Past Medical History:  Diagnosis Date  . Chlamydia   . Depression    postpartum with first baby   . Infection    UTI  . Trichomonosis    hx  . Urinary tract infection   . UTI (urinary tract infection) during pregnancy, second trimester 08/18/2016    Past Surgical History:  Procedure Laterality Date  . CESAREAN SECTION  2010  . CESAREAN SECTION  02/02/2011   Procedure: CESAREAN SECTION;  Surgeon: Kathreen Cosier, MD;  Location: [facility];  Service: Gynecology;  Laterality: N/A;  Repeat  . CESAREAN SECTION N/A 04/03/2013   Procedure: REPEAT CESAREAN SECTION;  Surgeon: Kathreen Cosier, MD;  Location: [facility];  Service: Obstetrics;  Laterality: N/A;  X2  . CESAREAN SECTION N/A 11/05/2016   Procedure: CESAREAN SECTION;  Surgeon: [place] Bing, MD;  Location: [facility];  Service: Obstetrics;  Laterality: N/A;    Family History  Problem Relation Age of Onset  . Diabetes Paternal Grandmother   . Hypertension Paternal Grandmother   . Cancer Paternal Grandmother        breast  . Cancer Paternal Grandfather        brain  . Hypertension Father   .  Diabetes Father   . Heart disease Father   . Hypertension Mother   . Diabetes Mother   . Hearing loss Neg Hx     Social History   Tobacco Use  . Smoking status: Never Smoker  . Smokeless tobacco: Never Used  Substance Use Topics  . Alcohol use: No  . Drug use: No    Allergies: No Known Allergies  No medications prior to admission.    Review of Systems  Constitutional: Negative for chills and fever.  Gastrointestinal: Positive for abdominal pain.  Genitourinary: Positive for vaginal bleeding and vaginal discharge. Negative for dysuria, frequency and urgency.   Physical Exam   Blood pressure 120/65, pulse 82, temperature 98.8 F (37.1 C), temperature source Oral, resp. rate 17, weight 114 lb (51.7 kg), last menstrual period 06/01/2017, SpO2 98 %, unknown if currently breastfeeding.  Physical Exam  Constitutional: She is oriented to person, place, and time. She appears well-developed and well-nourished. No distress.  HENT:  Head: Normocephalic and atraumatic.  Neck: Normal range of motion.  Cardiovascular: Normal rate.  Respiratory: Effort normal. No respiratory distress.  GI: Soft.  She exhibits no distension and no mass. There is no tenderness. There is no rebound and no guarding.  Genitourinary:  Genitourinary Comments: External: no lesions or erythema Vagina: rugated, pink, moist, mod drk bloody discharge Uterus: + enlarged, anteverted, + tender, no CMT Adnexae: no masses, no tenderness left, no tenderness right Cervix closed   Musculoskeletal: Normal range of motion.  Neurological: She is alert and oriented to person, place, and time.  Skin: Skin is warm and dry.  Psychiatric: She has a normal mood and affect.   Results for orders placed or performed during the hospital encounter of 08/26/17 (from the past 24 hour(s))  Urinalysis, Routine w reflex microscopic     Status: Abnormal   Collection Time: 08/26/17  3:37 PM  Result Value Ref Range   Color, Urine  YELLOW YELLOW   APPearance HAZY (A) CLEAR   Specific Gravity, Urine 1.020 1.005 - 1.030   pH 5.0 5.0 - 8.0   Glucose, UA NEGATIVE NEGATIVE mg/dL   Hgb urine dipstick LARGE (A) NEGATIVE   Bilirubin Urine NEGATIVE NEGATIVE   Ketones, ur NEGATIVE NEGATIVE mg/dL   Protein, ur NEGATIVE NEGATIVE mg/dL   Nitrite NEGATIVE NEGATIVE   Leukocytes, UA LARGE (A) NEGATIVE   RBC / HPF 0-5 0 - 5 RBC/hpf   WBC, UA >50 (H) 0 - 5 WBC/hpf   Bacteria, UA RARE (A) NONE SEEN   Squamous Epithelial / LPF 0-5 0 - 5   Mucus PRESENT   Wet prep, genital     Status: Abnormal   Collection Time: 08/26/17  4:18 PM  Result Value Ref Range   Yeast Wet Prep HPF POC NONE SEEN NONE SEEN   Trich, Wet Prep NONE SEEN NONE SEEN   Clue Cells Wet Prep HPF POC PRESENT (A) NONE SEEN   WBC, Wet Prep HPF POC FEW (A) NONE SEEN   Sperm NONE SEEN   Pregnancy, urine POC     Status: Abnormal   Collection Time: 08/26/17  4:40 PM  Result Value Ref Range   Preg Test, Ur POSITIVE (A) NEGATIVE  CBC     Status: Abnormal   Collection Time: 08/26/17  4:59 PM  Result Value Ref Range   WBC 7.6 4.0 - 10.5 K/uL   RBC 4.12 3.87 - 5.11 MIL/uL   Hemoglobin 11.5 (L) 12.0 - 15.0 g/dL   HCT 40.9 81.1 - 91.4 %   MCV 87.6 78.0 - 100.0 fL   MCH 27.9 26.0 - 34.0 pg   MCHC 31.9 30.0 - 36.0 g/dL   RDW 78.2 (H) 95.6 - 21.3 %   Platelets 365 150 - 400 K/uL  hCG, quantitative, pregnancy     Status: Abnormal   Collection Time: 08/26/17  4:59 PM  Result Value Ref Range   hCG, Beta Chain, Quant, S 1,357 (H) <5 mIU/mL   US Ob Less Than 14 Weeks With Ob Transvaginal  Result Date: 08/26/2017 CLINICAL DATA:  Bleeding during early pregnancy. Beta HCG equal 1357. Estimated gestational age by last menstrual period equals 12 weeks 2 days EXAM: OBSTETRIC <14 WK [place] AND TRANSVAGINAL OB US TECHNIQUE: Both transabdominal and transvaginal ultrasound examinations were performed for complete evaluation of the gestation as well as the maternal uterus, adnexal regions,  and pelvic cul-de-sac. Transvaginal technique was performed to assess early pregnancy. COMPARISON:  None. FINDINGS: Intrauterine gestational sac: Not identified Yolk sac:  Not identified Embryo:  Not identified Subchorionic hemorrhage:  None visualized. Maternal uterus/adnexae: The endometrium is thickened to 28 mm. No  fluid or gestational sac as above. Ovaries are normal. No free-fluid. Patient actively bleeding during exam. IMPRESSION: 1. No intrauterine gestation identified. Findings discordant with estimated gestational age by last menstrual period. Patient actively bleeding. Findings are most suggestive of spontaneous abortion in progress. Electronically Signed   By: Genevive Bi M.D.   On: 08/26/2017 18:49   MAU Course  Procedures Tylenol  MDM Labs and [place] ordered and reviewed. Will treat BV. No IUP or adnexal mass seen on [place], findings could indicate early pregnancy, ectopic pregnancy, or failed pregnancy-discussed with pt. Will follow quant in 48 hrs. Stable for discharge home.  Assessment and Plan   1. Pregnancy, location unknown   2. Vaginal bleeding in pregnancy   3. Blood type, Rh positive   4. Bacterial vaginosis    Discharge home Follow up in MAU on 08/28/17 for labs Ectopic/recturn precautions Rx Flagyl  Allergies as of 08/26/2017   No Known Allergies     Medication List    STOP taking these medications   benzocaine 10 % mucosal gel Commonly known as:  ORAJEL   ferrous sulfate 325 (65 FE) MG tablet   ibuprofen 200 MG tablet Commonly known as:  ADVIL,MOTRIN   ibuprofen 600 MG tablet Commonly known as:  ADVIL,MOTRIN   oxyCODONE 5 MG immediate release tablet Commonly known as:  Oxy IR/ROXICODONE     TAKE these medications   metroNIDAZOLE 500 MG tablet Commonly known as:  FLAGYL Take 1 tablet (500 mg total) by mouth 2 (two) times daily.   Prenatal Vitamins 0.8 MG tablet Take 1 tablet by mouth daily.      Donette Larry, CNM 08/26/2017, 7:26 PM

## 2017-08-29 LAB — GC/CHLAMYDIA PROBE AMP (~~LOC~~) NOT AT ARMC
CHLAMYDIA, DNA PROBE: POSITIVE — AB
Neisseria Gonorrhea: POSITIVE — AB

## 2017-08-30 ENCOUNTER — Telehealth (HOSPITAL_COMMUNITY): Payer: Self-pay | Admitting: *Deleted

## 2017-08-30 ENCOUNTER — Telehealth: Payer: Self-pay | Admitting: Family Medicine

## 2017-08-30 NOTE — Telephone Encounter (Signed)
Called patient about her appointment for an STD Tx. [facility]

## 2017-08-31 ENCOUNTER — Ambulatory Visit: Payer: Medicaid Other

## 2018-01-02 IMAGING — US US OB COMP LESS 14 WK
1 series · 15 of 15 positions shown · non-contrast
Comparison: None.

CLINICAL DATA: Pregnant with vaginal bleeding.

EXAM:
OBSTETRIC <14 WK ULTRASOUND
TECHNIQUE: Transabdominal ultrasound was performed for evaluation of the
gestation as well as the maternal uterus and adnexal regions.

[Series 1: us ob comp less 14 wk · 15 acquisitions, 15 frames shown]
[im 1/15]
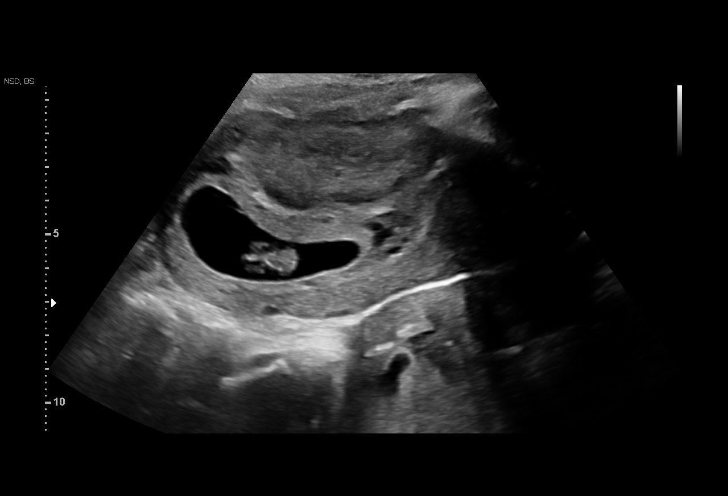
[im 2/15]
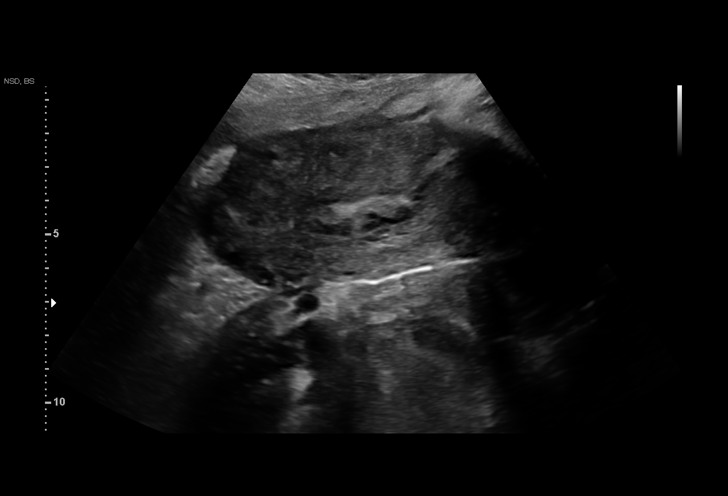
[im 3/15]
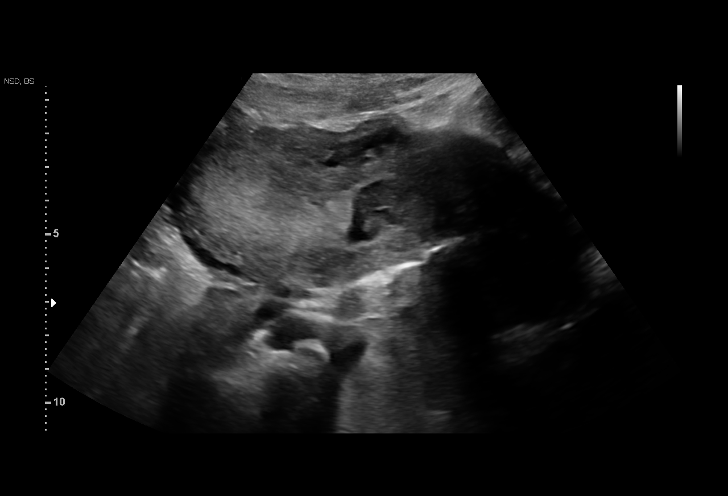
[im 4/15]
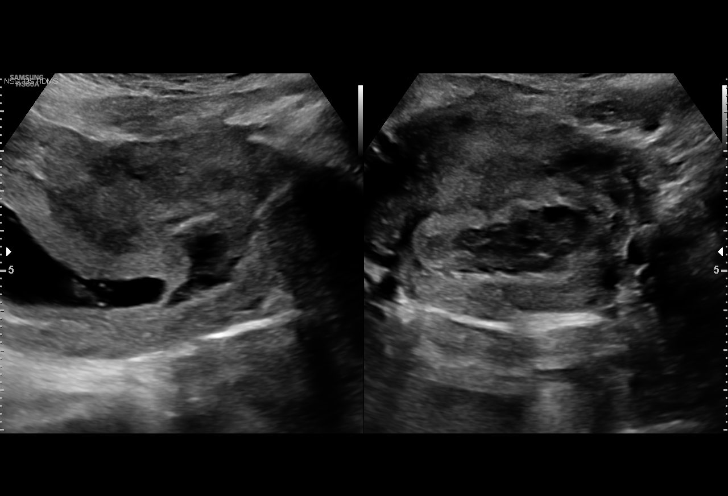
[im 5/15]
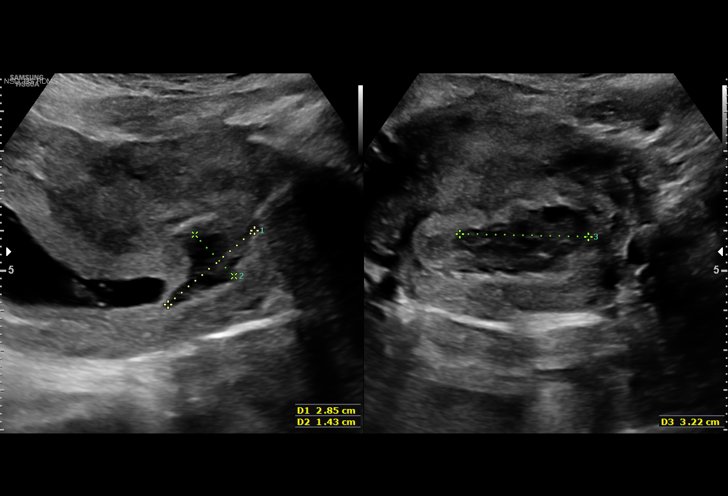
[im 6/15]
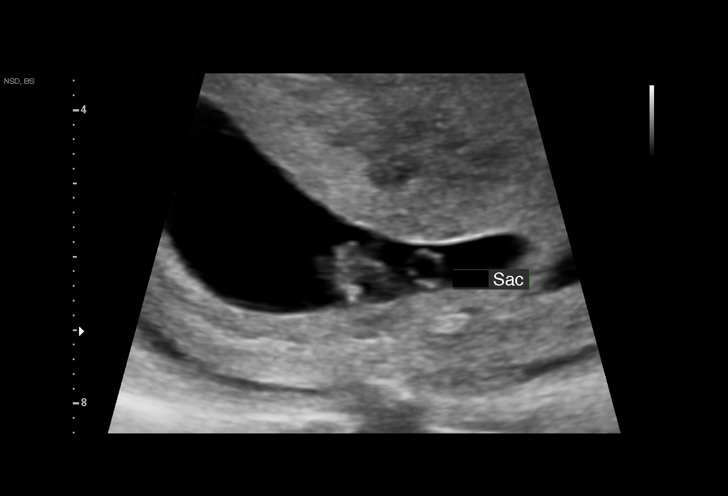
[im 7/15]
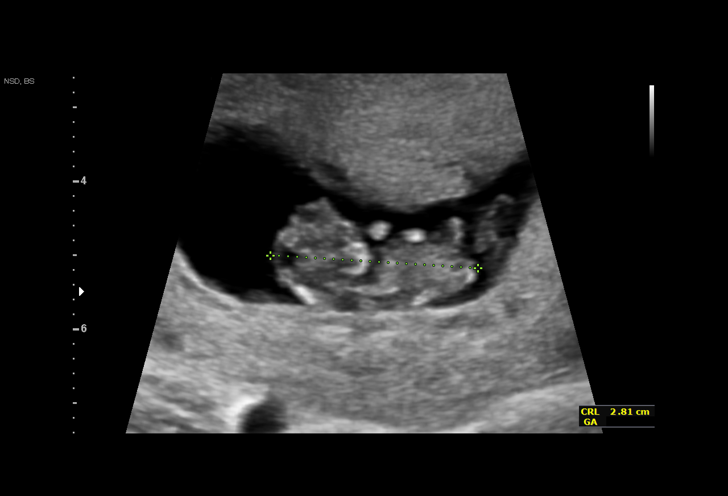
[im 8/15]
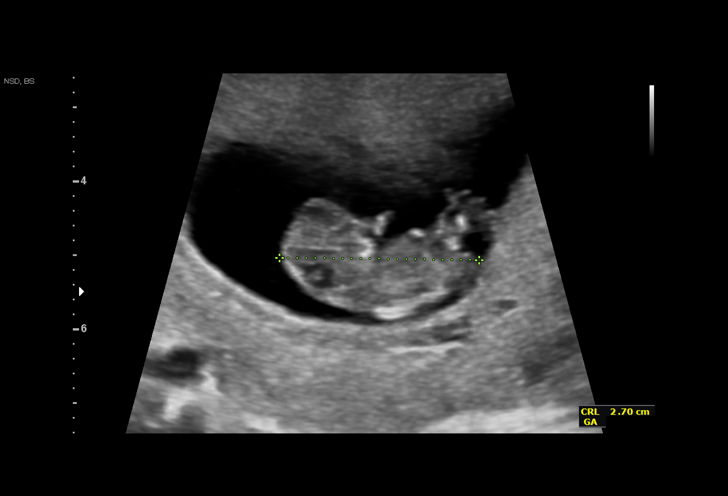
[im 9/15]
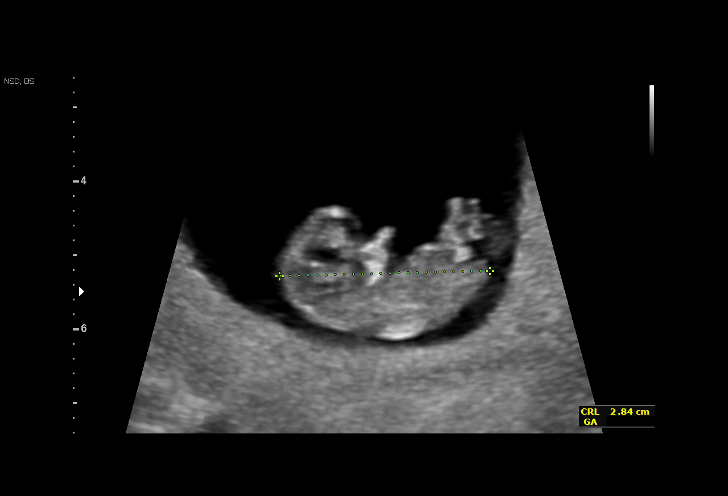
[im 10/15]
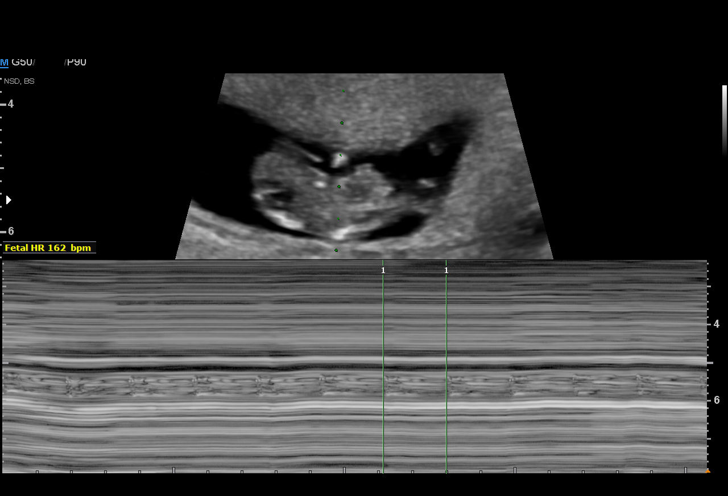
[im 11/15]
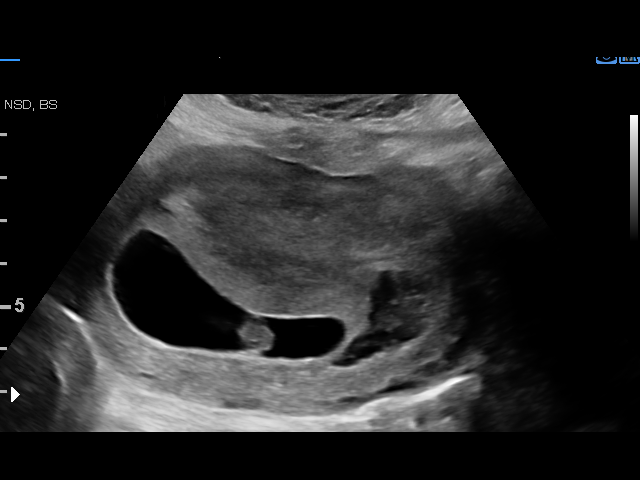
[im 12/15]
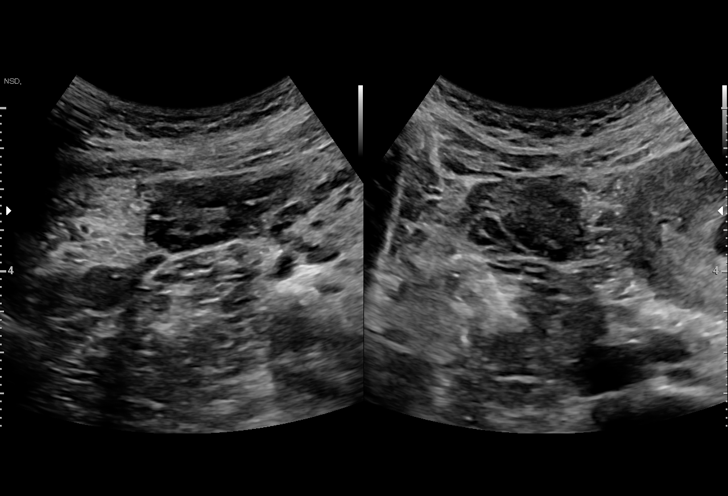
[im 13/15]
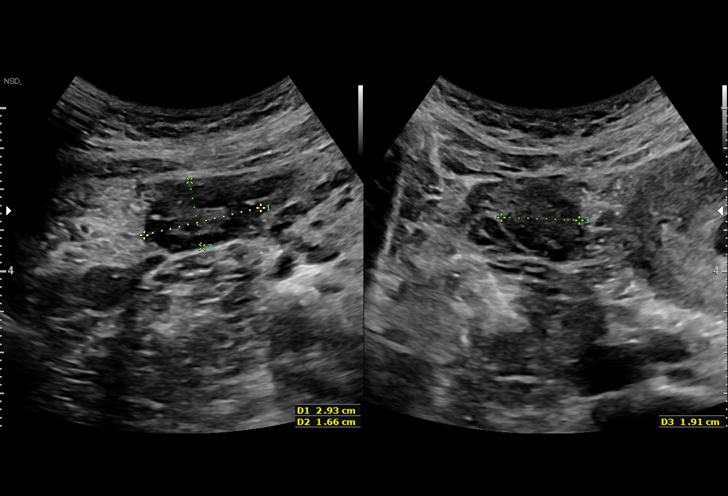
[im 14/15]
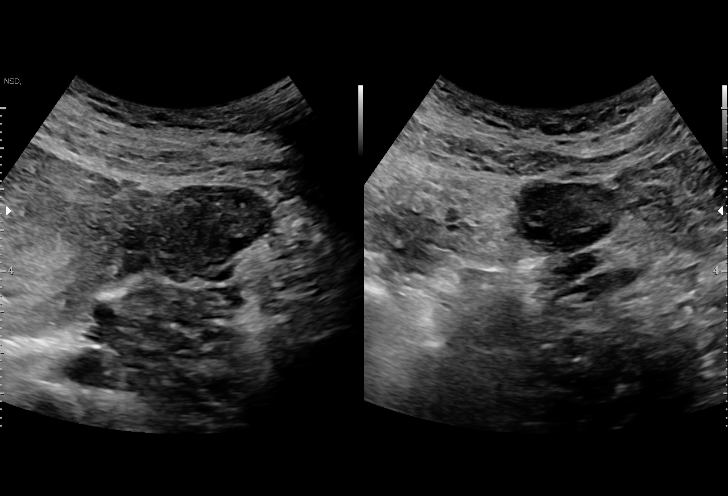
[im 15/15]
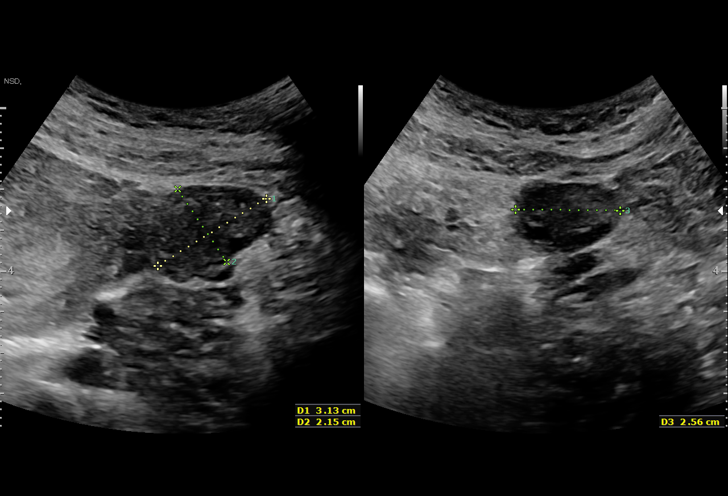

[15 of 15 positions shown; findings below may reference images not displayed]

FINDINGS: Intrauterine gestational sac: Present

Yolk sac:  Present

Embryo:  Present

Cardiac Activity: Present

Heart Rate: 162 bpm

CRL:   27.8  mm   9 w 4 d                  US EDC: 11/10/2016

Subchorionic hemorrhage:  Moderate

Maternal uterus/adnexae: Normal ovaries.
IMPRESSION: Single living intrauterine fetus estimated at 9 weeks and 4 days
gestation.

Moderate subchorionic hemorrhage.

Normal ovaries.

## 2018-05-31 ENCOUNTER — Encounter (HOSPITAL_COMMUNITY): Payer: Self-pay

## 2019-05-19 IMAGING — US US OB < 14 WEEKS - US OB TV
1 series · 15 of 28 positions shown · non-contrast
Comparison: None.

CLINICAL DATA: Bleeding during early pregnancy. Beta HCG equal
8151. Estimated gestational age by last menstrual period equals 12
weeks 2 days

EXAM:
OBSTETRIC <14 WK US AND TRANSVAGINAL OB US
TECHNIQUE: Both transabdominal and transvaginal ultrasound examinations were
performed for complete evaluation of the gestation as well as the
maternal uterus, adnexal regions, and pelvic cul-de-sac.
Transvaginal technique was performed to assess early pregnancy.

[Series 1: us ob < 14 weeks - us ob tv · 105 acquisitions, 15 frames shown]
[im 1/105]
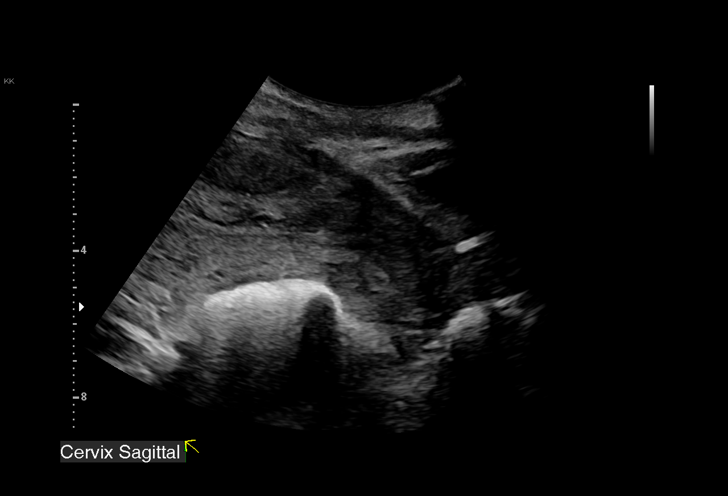
[im 8/105]
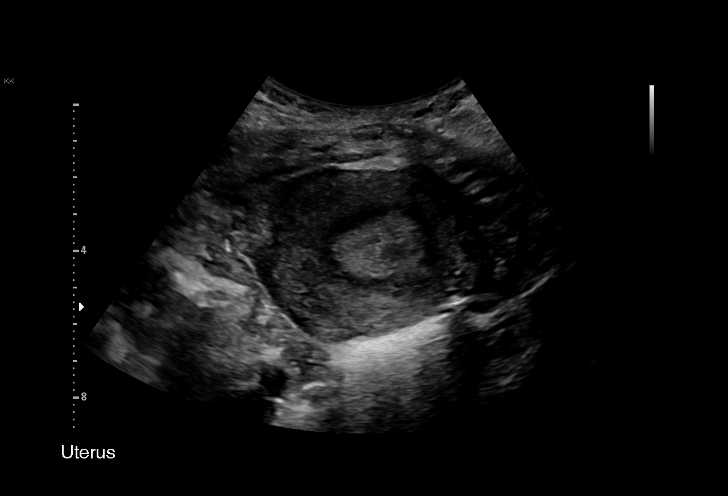
[im 16/105]
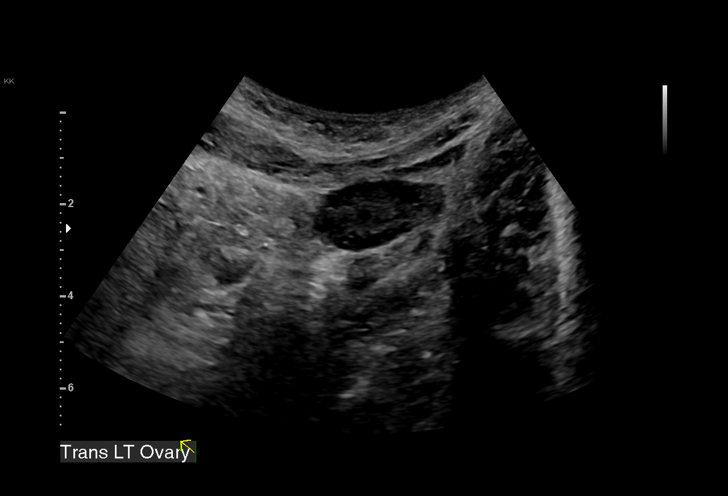
[im 24/105]
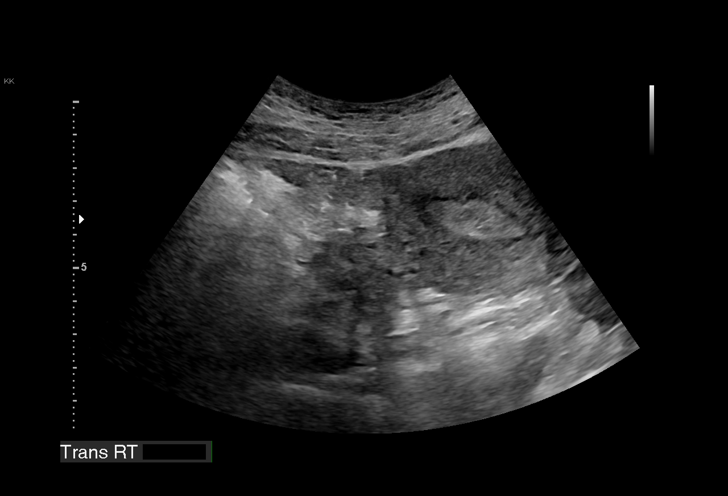
[im 31/105]
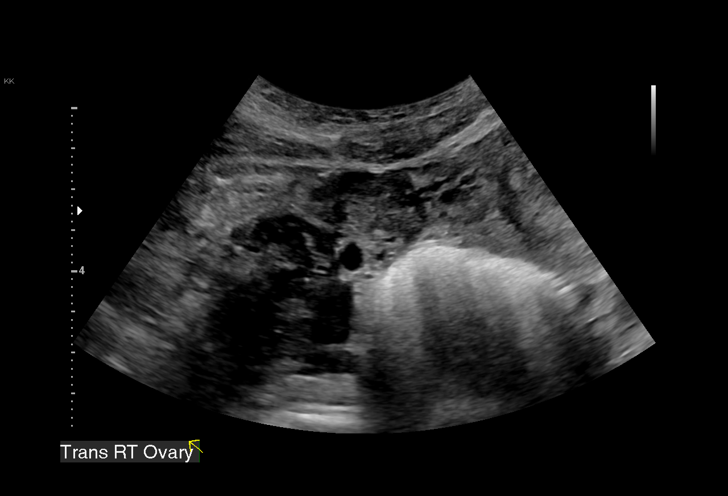
[im 39/105]
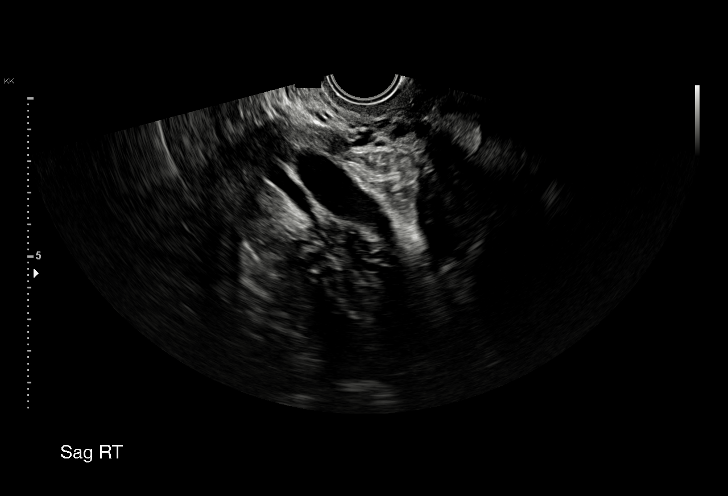
[im 47/105]
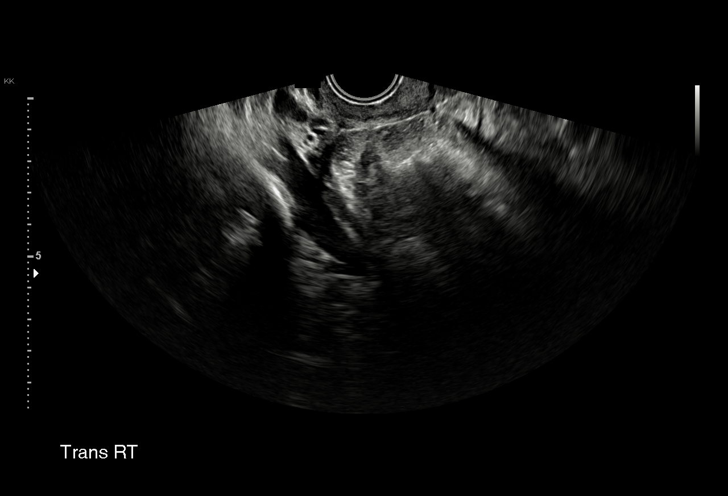
[im 54/105]
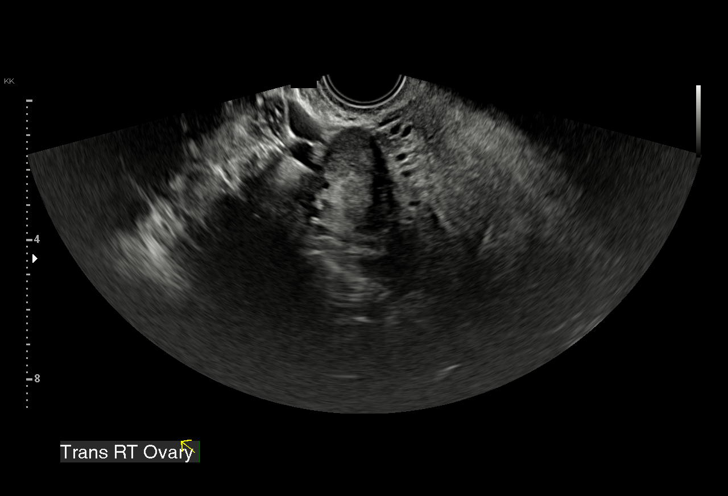
[im 58/105]
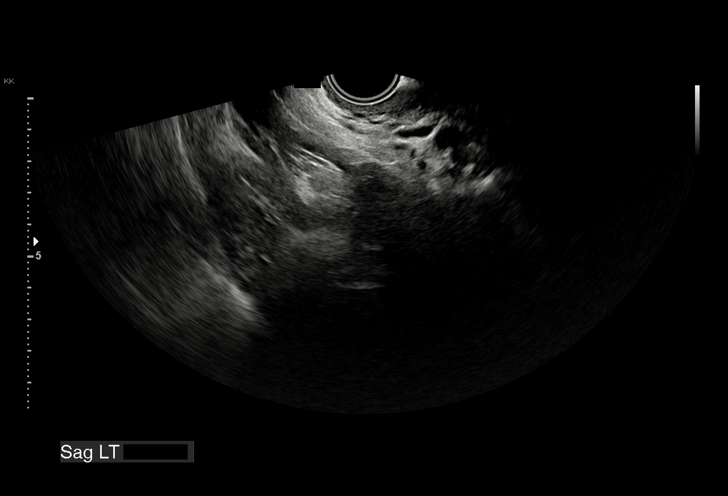
[im 66/105]
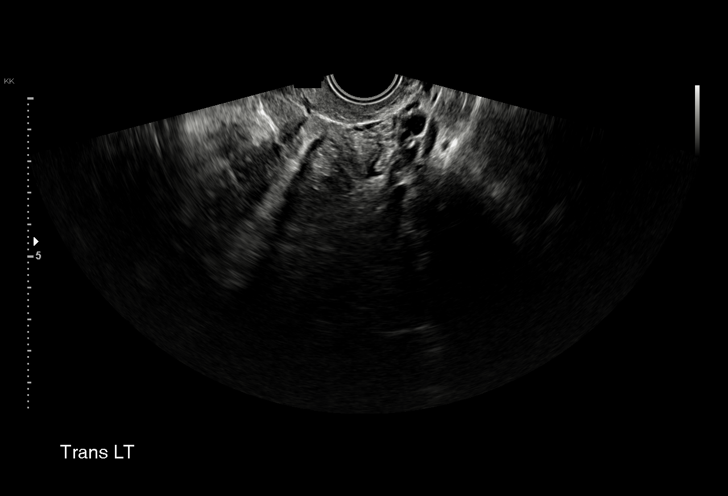
[im 74/105]
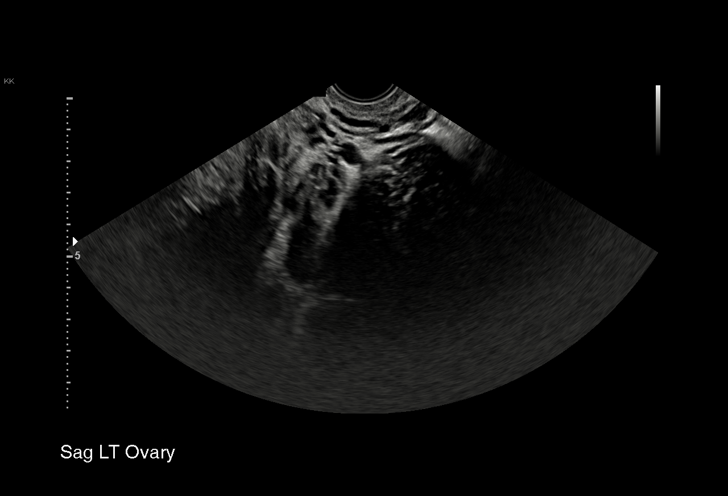
[im 81/105]
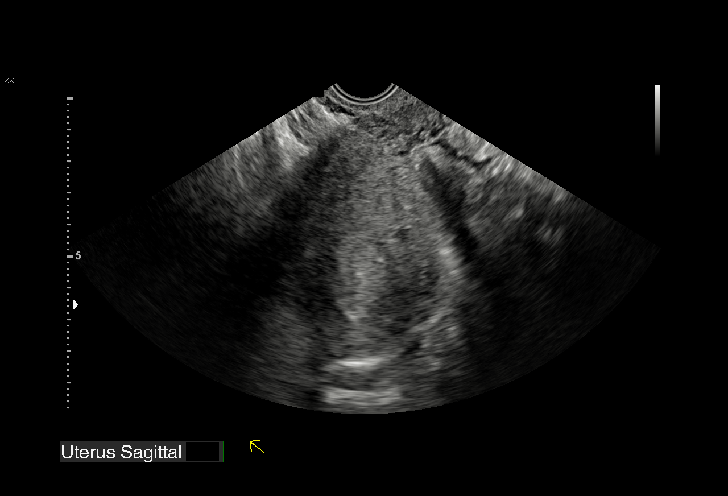
[im 89/105]
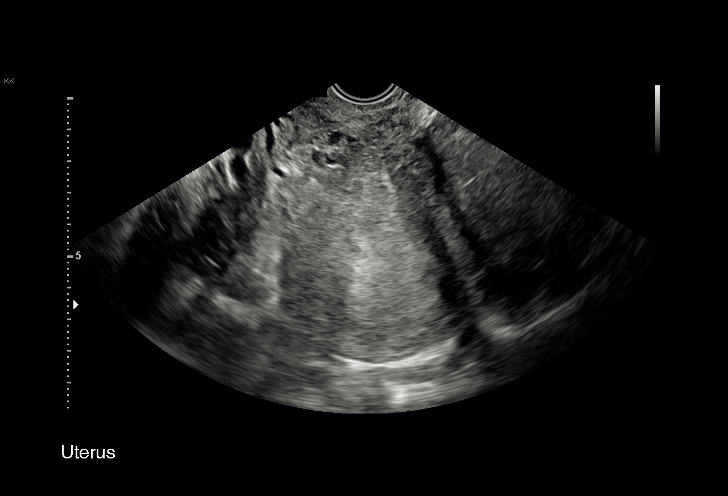
[im 97/105]
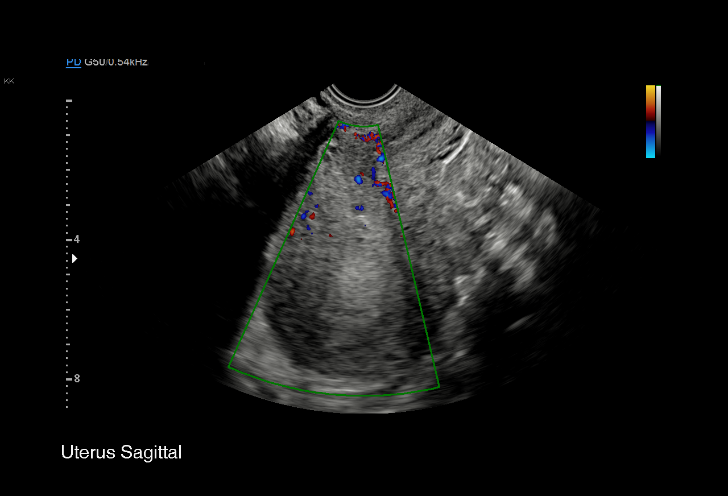
[im 105/105]
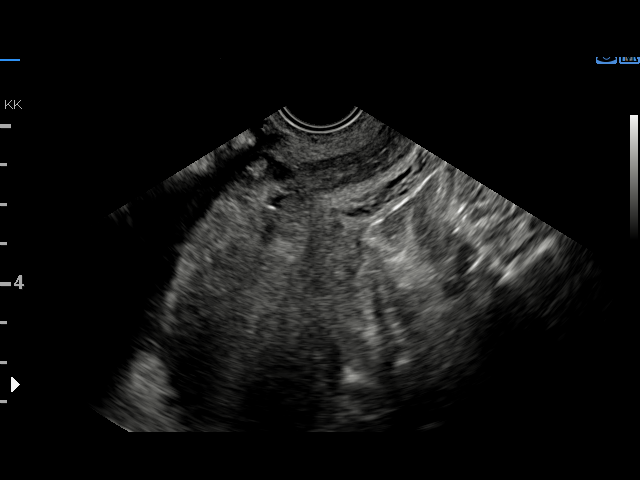

[15 of 28 positions shown; findings below may reference images not displayed]

FINDINGS: Intrauterine gestational sac: Not identified

Yolk sac:  Not identified

Embryo:  Not identified

Subchorionic hemorrhage:  None visualized.

Maternal uterus/adnexae: The endometrium is thickened to 28 mm. No
fluid or gestational sac as above. Ovaries are normal. No
free-fluid. Patient actively bleeding during exam.
IMPRESSION: 1. No intrauterine gestation identified. Findings discordant with
estimated gestational age by last menstrual period. Patient actively
bleeding. Findings are most suggestive of spontaneous abortion in
progress.
